# Patient Record
Sex: Female | Born: 1965 | Race: White | Hispanic: No | Marital: Married | Smoking: Former smoker
Health system: Southern US, Community
[De-identification: ages and names within clinical notes are randomized; demographics above are authoritative.]

## PROBLEM LIST (undated history)

## (undated) DIAGNOSIS — K5790 Diverticulosis of intestine, part unspecified, without perforation or abscess without bleeding: Secondary | ICD-10-CM

## (undated) DIAGNOSIS — K921 Melena: Secondary | ICD-10-CM

## (undated) DIAGNOSIS — Z8619 Personal history of other infectious and parasitic diseases: Secondary | ICD-10-CM

## (undated) HISTORY — DX: Melena: K92.1

## (undated) HISTORY — PX: COLONOSCOPY: SHX174

## (undated) HISTORY — DX: Diverticulosis of intestine, part unspecified, without perforation or abscess without bleeding: K57.90

## (undated) HISTORY — DX: Personal history of other infectious and parasitic diseases: Z86.19

## (undated) HISTORY — PX: WISDOM TOOTH EXTRACTION: SHX21

---

## 1997-05-10 ENCOUNTER — Inpatient Hospital Stay (HOSPITAL_COMMUNITY): Admission: AD | Admit: 1997-05-10 | Discharge: 1997-05-11 | Payer: Self-pay | Admitting: Obstetrics and Gynecology

## 1997-05-11 ENCOUNTER — Encounter (HOSPITAL_COMMUNITY): Admission: RE | Admit: 1997-05-11 | Discharge: 1997-08-09 | Payer: Self-pay | Admitting: Obstetrics and Gynecology

## 1997-06-13 ENCOUNTER — Other Ambulatory Visit: Admission: RE | Admit: 1997-06-13 | Discharge: 1997-06-13 | Payer: Self-pay | Admitting: Obstetrics and Gynecology

## 1997-08-11 ENCOUNTER — Encounter (HOSPITAL_COMMUNITY): Admission: RE | Admit: 1997-08-11 | Discharge: 1997-11-09 | Payer: Self-pay | Admitting: Obstetrics and Gynecology

## 1997-11-14 ENCOUNTER — Encounter (HOSPITAL_COMMUNITY): Admission: RE | Admit: 1997-11-14 | Discharge: 1998-02-12 | Payer: Self-pay | Admitting: *Deleted

## 1998-03-10 ENCOUNTER — Encounter (HOSPITAL_COMMUNITY): Admission: RE | Admit: 1998-03-10 | Discharge: 1998-07-09 | Payer: Self-pay | Admitting: *Deleted

## 1998-06-16 ENCOUNTER — Other Ambulatory Visit: Admission: RE | Admit: 1998-06-16 | Discharge: 1998-06-16 | Payer: Self-pay | Admitting: Obstetrics and Gynecology

## 1999-07-21 ENCOUNTER — Other Ambulatory Visit: Admission: RE | Admit: 1999-07-21 | Discharge: 1999-07-21 | Payer: Self-pay | Admitting: Obstetrics and Gynecology

## 2000-09-05 ENCOUNTER — Other Ambulatory Visit: Admission: RE | Admit: 2000-09-05 | Discharge: 2000-09-05 | Payer: Self-pay | Admitting: Obstetrics and Gynecology

## 2001-10-18 ENCOUNTER — Other Ambulatory Visit: Admission: RE | Admit: 2001-10-18 | Discharge: 2001-10-18 | Payer: Self-pay | Admitting: Obstetrics and Gynecology

## 2002-12-06 ENCOUNTER — Other Ambulatory Visit: Admission: RE | Admit: 2002-12-06 | Discharge: 2002-12-06 | Payer: Self-pay | Admitting: Obstetrics and Gynecology

## 2003-05-21 ENCOUNTER — Other Ambulatory Visit: Admission: RE | Admit: 2003-05-21 | Discharge: 2003-05-21 | Payer: Self-pay | Admitting: Obstetrics and Gynecology

## 2003-12-09 ENCOUNTER — Other Ambulatory Visit: Admission: RE | Admit: 2003-12-09 | Discharge: 2003-12-09 | Payer: Self-pay | Admitting: Obstetrics and Gynecology

## 2004-05-27 ENCOUNTER — Other Ambulatory Visit: Admission: RE | Admit: 2004-05-27 | Discharge: 2004-05-27 | Payer: Self-pay | Admitting: Obstetrics and Gynecology

## 2004-11-19 ENCOUNTER — Other Ambulatory Visit: Admission: RE | Admit: 2004-11-19 | Discharge: 2004-11-19 | Payer: Self-pay | Admitting: Obstetrics and Gynecology

## 2009-01-28 ENCOUNTER — Encounter (INDEPENDENT_AMBULATORY_CARE_PROVIDER_SITE_OTHER): Payer: Self-pay | Admitting: *Deleted

## 2009-03-20 ENCOUNTER — Ambulatory Visit: Payer: Self-pay | Admitting: Gastroenterology

## 2009-03-20 DIAGNOSIS — R195 Other fecal abnormalities: Secondary | ICD-10-CM

## 2009-03-25 ENCOUNTER — Encounter (INDEPENDENT_AMBULATORY_CARE_PROVIDER_SITE_OTHER): Payer: Self-pay | Admitting: *Deleted

## 2009-04-10 ENCOUNTER — Encounter (INDEPENDENT_AMBULATORY_CARE_PROVIDER_SITE_OTHER): Payer: Self-pay | Admitting: *Deleted

## 2009-04-11 ENCOUNTER — Ambulatory Visit: Payer: Self-pay | Admitting: Gastroenterology

## 2009-04-18 ENCOUNTER — Ambulatory Visit: Payer: Self-pay | Admitting: Gastroenterology

## 2010-03-17 NOTE — Letter (Signed)
Summary: Results Letter  [facility]  [address] [place], [place] [address]   Phone: [phone]  Fax: [phone]        March 20, 2009 MRN: [id number]    [facility] [place] [address] [place], [place]  [address]    Dear Ms. Cala,  It is my pleasure to have treated you recently as a new patient in my office. I appreciate your confidence and the opportunity to participate in your care.  Since I do have a busy inpatient endoscopy schedule and office schedule, my office hours vary weekly. I am, however, available for emergency calls everyday through my office. If I am not available for an urgent office appointment, another one of our gastroenterologist will be able to assist you.  My well-trained staff are prepared to help you at all times. For emergencies after office hours, a physician from our Gastroenterology section is always available through my 24 hour answering service  Once again I welcome you as a new patient and I look forward to a happy and healthy relationship             Sincerely,  Louis Meckel MD  This letter has been electronically signed by your physician.  Appended Document: Results Letter letter mailed

## 2010-03-17 NOTE — Letter (Signed)
Summary: [facility] Instructions  [facility]  [address] [place], [place] [address]   Phone: [phone]  Fax: [phone]       Joanna Velez    [birth year]    MRN: [id number]        Procedure Day Dorna Bloom:  Farrell Ours  2:30PM     Arrival Time:  1:30PM     Procedure Time:  2:30PM     Location of Procedure:                    _ X_  [facility] (4th Floor)   PREPARATION FOR COLONOSCOPY WITH MOVIPREP   Starting 5 days prior to your procedure 04/13/09 do not eat nuts, seeds, popcorn, corn, beans, peas,  salads, or any raw vegetables.  Do not take any fiber supplements (e.g. Metamucil, Citrucel, and Benefiber).  THE DAY BEFORE YOUR PROCEDURE         DATE: 04/17/09  DAY: THURSDAY  1.  Drink clear liquids the entire day-NO SOLID FOOD  2.  Do not drink anything colored red or purple.  Avoid juices with pulp.  No orange juice.  3.  Drink at least 64 oz. (8 glasses) of fluid/clear liquids during the day to prevent dehydration and help the prep work efficiently.  CLEAR LIQUIDS INCLUDE: Water Jello Ice Popsicles Tea (sugar ok, no milk/cream) Powdered fruit flavored drinks Coffee (sugar ok, no milk/cream) Gatorade Juice: apple, white grape, white cranberry  Lemonade Clear bullion, consomm, broth Carbonated beverages (any kind) Strained chicken noodle soup Hard Candy                             4.  In the morning, mix first dose of MoviPrep solution:    Empty 1 Pouch A and 1 Pouch B into the disposable container    Add lukewarm drinking water to the top line of the container. Mix to dissolve    Refrigerate (mixed solution should be used within 24 hrs)  5.  Begin drinking the prep at 5:00 p.m. The MoviPrep container is divided by 4 marks.   Every 15 minutes drink the solution down to the next mark (approximately 8 oz) until the full liter is complete.   6.  Follow completed prep with 16 oz of clear liquid of your choice (Nothing red or purple).   Continue to drink clear liquids until bedtime.  7.  Before going to bed, mix second dose of MoviPrep solution:    Empty 1 Pouch A and 1 Pouch B into the disposable container    Add lukewarm drinking water to the top line of the container. Mix to dissolve    Refrigerate  THE DAY OF YOUR PROCEDURE      DATE: 04/18/09  DAY: FRIDAY  Beginning at 9:30AM (5 hours before procedure):         1. Every 15 minutes, drink the solution down to the next mark (approx 8 oz) until the full liter is complete.  2. Follow completed prep with 16 oz. of clear liquid of your choice.    3. You may drink clear liquids until 12:30PM (2 HOURS BEFORE PROCEDURE).   MEDICATION INSTRUCTIONS  Unless otherwise instructed, you should take regular prescription medications with a small sip of water   as early as possible the morning of your procedure.         OTHER INSTRUCTIONS  You will need a responsible adult at  least 47 years of age to accompany you and drive you home.   This person must remain in the waiting room during your procedure.  Wear loose fitting clothing that is easily removed.  Leave jewelry and other valuables at home.  However, you may wish to bring a book to read or  an iPod/MP3 player to listen to music as you wait for your procedure to start.  Remove all body piercing jewelry and leave at home.  Total time from sign-in until discharge is approximately 2-3 hours.  You should go home directly after your procedure and rest.  You can resume normal activities the  day after your procedure.  The day of your procedure you should not:   Drive   Make legal decisions   Operate machinery   Drink alcohol   Return to work  You will receive specific instructions about eating, activities and medications before you leave.    The above instructions have been reviewed and explained to me by   Wyona Almas RN  April 11, 2009 1:13 PM     I fully understand and can verbalize these  instructions _____________________________ Date _________

## 2010-03-17 NOTE — Assessment & Plan Note (Signed)
Summary: RECTAL BLEEDING--[facility]   History of Present Illness Visit Type: Initial Consult Primary GI MD: Melvia Heaps MD [facility] Primary Provider: Oval Linsey, PA Requesting Provider: Richardean Chimera, MD Chief Complaint: Rectal bleed, hemorrhoid History of Present Illness:   Joanna Velez is a pleasant 45 year old white female referred at the request of Dr.McComb for evaluation of Hemoccult-positive stools. She has noted bright red blood on the toilet tissue which she attributes to a skin tag.  She has occasional rectal discomfort.  There has been no change in bowel habits or melena.  Hemoccults returned positive though she is unsure whether she was bleeding at that time.   GI Review of Systems    Reports weight gain.      Denies abdominal pain, acid reflux, belching, bloating, chest pain, dysphagia with liquids, dysphagia with solids, heartburn, loss of appetite, nausea, vomiting, vomiting blood, and  weight loss.      Reports hemorrhoids, rectal bleeding, and  rectal pain.     Denies anal fissure, black tarry stools, change in bowel habit, constipation, diarrhea, diverticulosis, fecal incontinence, heme positive stool, irritable bowel syndrome, jaundice, light color stool, and  liver problems. Preventive Screening-Counseling & Management  Alcohol-Tobacco     Smoking Status: quit  Caffeine-Diet-Exercise     Does Patient Exercise: yes      Drug Use:  no.      Current Medications (verified): 1)  Multivitamins  Tabs (Multiple Vitamin) .... 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  Alcohol Use - no Daily Caffeine Use -2 Illicit Drug Use - no Patient gets regular exercise. Smoking Status:  quit Drug Use:   no Does Patient Exercise:  yes  Review of Systems       The patient complains of back pain and night sweats.         All other systems were reviewed and were negative   Vital Signs:  Patient profile:   45 year old female Height:      63.5 inches Weight:      160.13 pounds BMI:     28.02 Pulse rate:   76 / minute Pulse rhythm:   regular BP sitting:   100 / 72  (left arm) Cuff size:   regular  Vitals Entered By: Joanna Velez CMA Duncan Dull) (March 20, 2009 10:04 AM)  Physical Exam  Additional Exam:  She is a well-developed well-nourished female  skin: anicteric HEENT: normocephalic; PEERLA; no nasal or pharyngeal abnormalities neck: supple nodes: no cervical lymphadenopathy chest: clear to ausculatation and percussion heart: no murmurs, gallops, or rubs abd: soft, nontender; BS normoactive; no abdominal masses, tenderness, organomegaly rectal: no masses; external skin tag is present ext: no cynanosis, clubbing, edema skeletal: no deformities neuro: oriented x 3; no focal abnormalities    Impression & Recommendations:  Problem # 1:  FECAL OCCULT BLOOD (ICD-792.1) Heme positive stool may be related to her skin tag and hemorrhoidal disease.  A more proximal colonic in source ought to be ruled out.  Recommendations #1 initial HC suppositories p.r.n. #2 colonoscopy  Risks, alternatives, and complications of the procedure, including bleeding, perforation, and possible need for surgery, were explained to the patient.  Patient's questions were answered.  Patient Instructions: 1)  Colonoscopy and Flexible Sigmoidoscopy brochure given.  2)  Conscious Sedation brochure given.  3)  You will need to call back at your convenience to schedule your Colonoscopy 4)  At that time you will be scheduled for a Previsit with a Nurse to sign papers and get all instructions 5)  cc Dr. Richardean Chimera 6)  The medication list was reviewed and reconciled.  All changed / newly prescribed  medications were explained.  A complete medication list was provided to the patient / caregiver. Prescriptions: ANUSOL-HC 25 MG SUPP (HYDROCORTISONE ACETATE) take one suppository per rectum q.h.s.  #7 x 0   Entered and Authorized by:   Louis Meckel MD   Signed by:   Louis Meckel MD on 03/20/2009   Method used:   Electronically to        [facility]* (retail)       [address]       [place], [place]  [address]       Ph: [phone]       Fax: [phone]   RxID:   [id number]

## 2010-03-17 NOTE — Letter (Signed)
Summary: Previsit letter  [facility]  [address] [place], [place] [address]   Phone: [phone]  Fax: [phone]       03/25/2009 MRN: [id number]  [facility] [place] [address] [place], [place]  [address]  Dear Ms. Menken,  Welcome to the Gastroenterology Division at [facility].    You are scheduled to see a nurse for your pre-procedure visit on 04-11-09 at 1pm on the 3rd floor at [facility], [address]  We ask that you try to arrive at our office 15 minutes prior to your appointment time to allow for check-in.  Your nurse visit will consist of discussing your medical and surgical history, your immediate family medical history, and your medications.    Please bring a complete list of all your medications or, if you prefer, bring the medication bottles and we will list them.  We will need to be aware of both prescribed and over the counter drugs.  We will need to know exact dosage information as well.  If you are on blood thinners (Coumadin, Plavix, Aggrenox, Ticlid, etc.) please call our office today/prior to your appointment, as we need to consult with your physician about holding your medication.   Please be prepared to read and sign documents such as consent forms, a financial agreement, and acknowledgement forms.  If necessary, and with your consent, a friend or relative is welcome to sit-in on the nurse visit with you.  Please bring your insurance card so that we may make a copy of it.  If your insurance requires a referral to see a specialist, please bring your referral form from your primary care physician.  No co-pay is required for this nurse visit.     If you cannot keep your appointment, please call [phone] to cancel or reschedule prior to your appointment date.  This allows [place] the opportunity to schedule an appointment for another patient in need of care.    Thank you for choosing [facility] for your medical needs.  We appreciate  the opportunity to care for you.  Please visit [place] at our website  to learn more about our practice.                     Sincerely.                                                                                                                   The Gastroenterology Division

## 2010-03-17 NOTE — Procedures (Signed)
Summary: Colonoscopy  Patient: Joanna Velez Note: All result statuses are Final unless otherwise noted.  Tests: (1) Colonoscopy (COL)   COL Colonoscopy           DONE     [facility]     [address]     [place], [place]  [address]           COLONOSCOPY PROCEDURE REPORT           PATIENT:  Joanna, Velez  MR#:  [id number]     BIRTHDATE:  1965-11-27, 44 yrs. old  GENDER:  female           ENDOSCOPIST:  Barbette Hair. Arlyce Dice, MD     Referred by:           PROCEDURE DATE:  04/18/2009     PROCEDURE:  Colonoscopy, Diagnostic     ASA CLASS:  Class I     INDICATIONS:  heme positive stool           MEDICATIONS:   Fentanyl 75 mcg IV, Versed 7 mg IV           DESCRIPTION OF PROCEDURE:   After the risks benefits and     alternatives of the procedure were thoroughly explained, informed     consent was obtained.  Digital rectal exam was performed and     revealed perianal skin tags.   The LB [id number] endoscope     was introduced through the anus and advanced to the cecum, which     was identified by both the appendix and ileocecal valve, without     limitations.  The quality of the prep was excellent, using     MoviPrep.  The instrument was then slowly withdrawn as the colon     was fully examined.     <<PROCEDUREIMAGES>>           FINDINGS:  Scattered diverticula were found (see image1, image4,     image5, and image11). sigmoid to ascending colon  This was     otherwise a normal examination of the colon (see image2, image3,     image7, and image12).   Retroflexed views in the rectum revealed     no abnormalities.    The scope was then withdrawn from the patient     and the procedure completed.           COMPLICATIONS:  None           ENDOSCOPIC IMPRESSION:     1) Diverticula, scattered     2) Otherwise normal examination           Heme positive stool likely secondary to peranal skin tag           RECOMMENDATIONS:     1) colonoscopy 10 years           REPEAT  EXAM:  In 10 year(s) for Colonoscopy.           ______________________________     Barbette Hair. Arlyce Dice, MD           CC:  Richardean Chimera, MD           n.     Rosalie DoctorBarbette Hair. Josealberto Montalto at 04/18/2009 03:04 PM           Karilyn Cota, [id number]  Note: An exclamation mark (!) indicates a result that was not dispersed into the flowsheet. Document Creation Date: 04/18/2009 3:04 PM _______________________________________________________________________  (1) Order result  status: Final Collection or observation date-time: 04/18/2009 14:56 Requested date-time:  Receipt date-time:  Reported date-time:  Referring Physician:   Ordering Physician: Melvia Heaps [phone]) Specimen Source:  Source: Launa Grill Order Number: [id number] Lab site:   Appended Document: Colonoscopy    Clinical Lists Changes  Observations: Added new observation of COLONNXTDUE: 04/2019 (04/18/2009 15:43)

## 2010-03-17 NOTE — Miscellaneous (Signed)
Summary: [facility] Previsit/prep  Clinical Lists Changes  Medications: Added new medication of MOVIPREP 100 GM  SOLR (PEG-KCL-NACL-NASULF-NA ASC-C) As per prep instructions. - Signed Rx of MOVIPREP 100 GM  SOLR (PEG-KCL-NACL-NASULF-NA ASC-C) As per prep instructions.;  #1 x 0;  Signed;  Entered by: Wyona Almas RN;  Authorized by: Louis Meckel MD;  Method used: Electronically to [facility]*, [address], [place], [place]  [address], Ph: [phone], Fax: [phone] Observations: Added new observation of NKA: T (04/11/2009 12:53)    Prescriptions: MOVIPREP 100 GM  SOLR (PEG-KCL-NACL-NASULF-NA ASC-C) As per prep instructions.  #1 x 0   Entered by:   Wyona Almas RN   Authorized by:   Louis Meckel MD   Signed by:   Wyona Almas RN on 04/11/2009   Method used:   Electronically to        [facility]* (retail)       [address]       [place], [place]  [address]       Ph: [phone]       Fax: [phone]   RxID:   [id number]

## 2014-06-12 ENCOUNTER — Encounter: Payer: Self-pay | Admitting: Gastroenterology

## 2014-07-03 ENCOUNTER — Ambulatory Visit (INDEPENDENT_AMBULATORY_CARE_PROVIDER_SITE_OTHER): Payer: BLUE CROSS/BLUE SHIELD | Admitting: Gastroenterology

## 2014-07-03 ENCOUNTER — Encounter: Payer: Self-pay | Admitting: Gastroenterology

## 2014-07-03 VITALS — BP 108/80 | HR 60 | Ht 62.75 in | Wt 175.5 lb

## 2014-07-03 DIAGNOSIS — K625 Hemorrhage of anus and rectum: Secondary | ICD-10-CM | POA: Diagnosis not present

## 2014-07-03 NOTE — Progress Notes (Signed)
    _                                                                                                                History of Present Illness:  Ms. Joanna Velez is a pleasant 49 year old white female referred at the request of Dr.McComb evaluation of rectal bleeding.  On several occasions she is noticed bright red blood mixed with the stools and in the toilet water.  Mucus has been seen as well.  On each occasion this occurred after a running marathon of 12-13 miles.  She denies abdominal pain.  Colonoscopy for Hemoccult-positive stool and limited rectal bleeding in 2011 demonstrated external skin tags only and scattered diverticula.   Past Medical History  Diagnosis Date  . Diverticulosis    History reviewed. No pertinent past surgical history. family history includes Alzheimer's disease in her paternal grandmother; Diabetes in her father; Gallbladder disease in her father; Heart defect in her daughter; Heart disease in her maternal grandfather; Lung cancer in her paternal grandfather; Macular degeneration in her mother; Stroke in her father. Current Outpatient Prescriptions  Medication Sig Dispense Refill  . calcium carbonate (OS-CAL) 600 MG TABS tablet Take 600 mg by mouth daily with breakfast.    . Cyanocobalamin (VITAMIN B12 PO) Take 1 tablet by mouth daily.    Marland Kitchen. ECHINACEA PO Take 1 tablet by mouth daily.    . Multiple Vitamins-Minerals (CENTRUM SILVER ULTRA WOMENS PO) Take 1 tablet by mouth daily.     No current facility-administered medications for this visit.   Allergies as of 07/03/2014  . (No Known Allergies)    reports that she quit smoking about 21 years ago. Her smoking use included Cigarettes. She has never used smokeless tobacco. She reports that she drinks alcohol. She reports that she does not use illicit drugs.   Review of Systems: Pertinent positive and negative review of systems were noted in the above HPI section. All other review of systems were otherwise  negative.  Vital signs were reviewed in today's medical record Physical Exam: General: Well developed , well nourished, no acute distress Skin: anicteric Head: Normocephalic and atraumatic Eyes:  sclerae anicteric, EOMI Ears: Normal auditory acuity Mouth: No deformity or lesions Neck: Supple, no masses or thyromegaly Lymph Nodes: no lymphadenopathy Lungs: Clear throughout to auscultation Heart: Regular rate and rhythm; no murmurs, rubs or bruits Gastroinestinal: Soft, non tender and non distended. No masses, hepatosplenomegaly or hernias noted. Normal Bowel sounds Rectal: External skin tag Musculoskeletal: Symmetrical with no gross deformities  Skin: No lesions on visible extremities Pulses:  Normal pulses noted Extremities: No clubbing, cyanosis, edema or deformities noted Neurological: Alert oriented x 4, grossly nonfocal Cervical Nodes:  No significant cervical adenopathy Inguinal Nodes: No significant inguinal adenopathy Psychological:  Alert and cooperative. Normal mood and affect  See Assessment and Plan under Problem List

## 2014-07-03 NOTE — Assessment & Plan Note (Signed)
Joanna Velez has had limited but gross hematochezia several times after a marathon run.  This raises the question of ischemic colitis.  Alternatively, bleeding could be hemorrhoidal.  Recommendations #1 sigmoidoscopy; if no mucosal abnormalities are seen would try to evaluate the patient immediately after an episode of bleeding.  I also suggested that she may reconsider distance running because of the possibility of ischemia.  She is able to run 2-3 miles regularly without incident.  CC Dr. Anthoney HaradaJohn Macomb

## 2014-07-03 NOTE — Patient Instructions (Signed)
You have been scheduled for a flexible sigmoidoscopy. Please follow the written instructions given to you at your visit today. If you use inhalers (even only as needed), please bring them with you on the day of your procedure.  

## 2014-07-09 ENCOUNTER — Encounter: Payer: Self-pay | Admitting: Gastroenterology

## 2014-07-12 ENCOUNTER — Ambulatory Visit (AMBULATORY_SURGERY_CENTER): Payer: BLUE CROSS/BLUE SHIELD | Admitting: Gastroenterology

## 2014-07-12 ENCOUNTER — Encounter: Payer: Self-pay | Admitting: Gastroenterology

## 2014-07-12 VITALS — BP 113/69 | HR 58 | Temp 96.8°F | Resp 22 | Ht 62.0 in | Wt 175.0 lb

## 2014-07-12 DIAGNOSIS — K648 Other hemorrhoids: Secondary | ICD-10-CM

## 2014-07-12 DIAGNOSIS — K625 Hemorrhage of anus and rectum: Secondary | ICD-10-CM | POA: Diagnosis not present

## 2014-07-12 MED ORDER — SODIUM CHLORIDE 0.9 % IV SOLN: 500.0000 mL | INTRAVENOUS | Status: DC

## 2014-07-12 NOTE — Patient Instructions (Signed)
YOU HAD AN ENDOSCOPIC PROCEDURE TODAY AT THE [facility]:   Refer to the procedure report that was given to you for any specific questions about what was found during the examination.  If the procedure report does not answer your questions, please call your gastroenterologist to clarify.  If you requested that your care partner not be given the details of your procedure findings, then the procedure report has been included in a sealed envelope for you to review at your convenience later.  YOU SHOULD EXPECT: Some feelings of bloating in the abdomen. Passage of more gas than usual.  Walking can help get rid of the air that was put into your GI tract during the procedure and reduce the bloating. If you had a lower endoscopy (such as a colonoscopy or flexible sigmoidoscopy) you may notice spotting of blood in your stool or on the toilet paper. If you underwent a bowel prep for your procedure, you may not have a normal bowel movement for a few days.  Please Note:  You might notice some irritation and congestion in your nose or some drainage.  This is from the oxygen used during your procedure.  There is no need for concern and it should clear up in a day or so.  SYMPTOMS TO REPORT IMMEDIATELY:   Following lower endoscopy (colonoscopy or flexible sigmoidoscopy):  Excessive amounts of blood in the stool  Significant tenderness or worsening of abdominal pains  Swelling of the abdomen that is new, acute  Fever of 100F or higher   For urgent or emergent issues, a gastroenterologist can be reached at any hour by calling [phone].   DIET: Your first meal following the procedure should be a small meal and then it is ok to progress to your normal diet. Heavy or fried foods are harder to digest and may make you feel nauseous or bloated.  Likewise, meals heavy in dairy and vegetables can increase bloating.  Drink plenty of fluids but you should avoid alcoholic beverages for 24  hours.  ACTIVITY:  You should plan to take it easy for the rest of today and you should NOT DRIVE or use heavy machinery until tomorrow (because of the sedation medicines used during the test).    FOLLOW UP: Our staff will call the number listed on your records the next business day following your procedure to check on you and address any questions or concerns that you may have regarding the information given to you following your procedure. If we do not reach you, we will leave a message.  However, if you are feeling well and you are not experiencing any problems, there is no need to return our call.  We will assume that you have returned to your regular daily activities without incident.  If any biopsies were taken you will be contacted by phone or by letter within the next 1-3 weeks.  Please call us at [phone] if you have not heard about the biopsies in 3 weeks.    SIGNATURES/CONFIDENTIALITY: You and/or your care partner have signed paperwork which will be entered into your electronic medical record.  These signatures attest to the fact that that the information above on your After Visit Summary has been reviewed and is understood.  Full responsibility of the confidentiality of this discharge information lies with you and/or your care-partner.  Hemorrhoids, Diverticulosis, high fiber diet-handouts given  Repeat colonoscopy 10 years 2026.

## 2014-07-12 NOTE — Progress Notes (Signed)
Report to PACU, RN, vss, BBS= Clear.  

## 2014-07-12 NOTE — Op Note (Signed)
[facility] [address] [place], [id number]   FLEXIBLE SIGMOIDOSCOPY PROCEDURE REPORT  PATIENT: Joanna Velez, Joanna Velez  MR#: [id number] BIRTHDATE: 05-18-1965 , 49  yrs. old GENDER: female ENDOSCOPIST: Louis Meckelobert D Roshawn Lacina, MD REFERRED BY: Richardean ChimeraJohn McComb, M.D. PROCEDURE DATE:  07/12/2014 PROCEDURE:   Sigmoidoscopy, diagnostic ASA CLASS:   Class II INDICATIONS:anal bleeding. MEDICATIONS: Monitored anesthesia care and Propofol 120 mg IV  DESCRIPTION OF PROCEDURE:   After the risks benefits and alternatives of the procedure were thoroughly explained, informed consent was obtained.  Digital exam revealed several skin tags. The LB [id number]  endoscope was introduced through the anus  and advanced to the descending colon , The exam was Without limitations.    The quality of the prep was    . Estimated blood loss is zero unless otherwise noted in this procedure report. The instrument was then slowly withdrawn as the mucosa was fully examined.         COLON FINDINGS: There was mild diverticulosis noted in the descending colon.   Internal hemorrhoids were found.    Retroflexed views revealed no abnormalities.    The scope was then withdrawn from the patient and the procedure terminated.  COMPLICATIONS: There were no immediate complications.  ENDOSCOPIC IMPRESSION: 1.   Mild diverticulosis was noted in the descending colon 2.   Internal hemorrhoids  RECOMMENDATIONS: hemorrhoidal suppositories as necessary  REPEAT EXAM:  eSigned:  Louis Meckelobert D Larina Lieurance, MD 07/12/2014 11:04 AM   CC:

## 2014-07-16 ENCOUNTER — Telehealth: Payer: Self-pay | Admitting: Emergency Medicine

## 2014-07-16 NOTE — Telephone Encounter (Signed)
  Follow up Call-  Call back number 07/12/2014  Post procedure Call Back phone  # [phone]  Permission to leave phone message Yes     Patient questions:  Do you have a fever, pain , or abdominal swelling? No. Pain Score  0 *  Have you tolerated food without any problems? Yes.    Have you been able to return to your normal activities? Yes.    Do you have any questions about your discharge instructions: Diet   No. Medications  No. Follow up visit  No.  Do you have questions or concerns about your Care? No.  Actions: * If pain score is 4 or above: No action needed, pain <4.

## 2018-08-21 NOTE — Progress Notes (Signed)
TELEHEALTH VISIT  Referring Provider: Richardean ChimeraMcComb, John, MD Primary Care Physician:  Richardean ChimeraMcComb, John, MD   Tele-visit due to COVID-19 pandemic Patient requested visit virtually, consented to the virtual encounter via video enabled telemedicine application Contact made at: 13:59 08/22/18 Patient verified by name and date of birth Location of patient: Car Location provider: [place] [facility] Names of persons participating: Me, patient, Joanna LeverDesiree Velez CMA Time spent on telehealth visit:  I discussed the limitations of evaluation and management by telemedicine. The patient expressed understanding and agreed to proceed.  Reason for Consultation: Melena   IMPRESSION:  Blood in the stool without associated anemia Skin tag as noted source of heme + stools by Dr. Arlyce DiceKaplan in 2011 Sigmoid diverticulosis seen on prior endoscopy No known family history of colon cancer or polyps  The differential for rectal bleeding without rectal pain is broad.  It includes outlet sources such as fissure or hemorrhoids, as well as her known skin tag, polyps, mass, ulcers, and colitis. She feels that the volume of bleeding is significant more than when she had bleeding from the skin tag. Given this differential I am recommending a colonoscopy.  PLAN: High fiber diet recommended, drink at least 1.5-2 liters of water Daily stool bulking agent with psyllium or methylcellulose recommended Colonoscopy Referral to Dr. Cliffton AstersWhite will be considered after colonoscopy given the history of bleeding from the skin tag  I consented the patient discussing the risks, benefits, and alternatives to endoscopic evaluation. In particular, we discussed the risks that include, but are not limited to, reaction to medication, cardiopulmonary compromise, bleeding requiring blood transfusion, aspiration resulting in pneumonia, perforation requiring surgery, lack of diagnosis, severe illness requiring hospitalization, and even death. We reviewed  the risk of missed lesion including polyps or even cancer. The patient acknowledges these risks and asks that we proceed.     HPI: Joanna Velez is a 53 y.o. female referred by Dr. Arelia SneddonMcComb for further evaluation of melena.  The history is obtained to the patient, review of her electronic health record, and referral records from Dr. Arelia SneddonMcComb. She reported blood in the stool at the time of her annual exam.   Has noticed more bright red blood in the stool a couple of weeks ago. Last occurred 4 weeks ago. Has historically had bleeding due to internal hemorrhoids when exercising but the volume of bleeding is much smaller than what she is experienced recently. She feels a skin tag at her rectum.  No associated rectal pain. No change in bowel habits. No straining. No sense of incomplete evacuation. No other associated symptoms. No identified exacerbating or relieving features.   She had a colonoscopy with Dr. Arlyce DiceKaplan for heme positive stools 04/18/09 that showed scattered diverticulosis.  He attributed the heme positive stool to a perianal skin tag.  Repeat colonoscopy recommended in 10 years.  She had a flexible sigmoidoscopy with Dr. Arlyce DiceKaplan 07/12/2014 to evaluate anorectal bleeding showed mild diverticulosis in the descending colon and internal hemorrhoids.  Hemoglobin 08/02/18: 14.7  Father with gallbladder issues. No known family history of colon cancer or polyps. No family history of uterine/endometrial cancer, pancreatic cancer or gastric/stomach cancer.  Past Medical History:  Diagnosis Date  . Diverticulosis     No past surgical history on file.  Current Outpatient Medications  Medication Sig Dispense Refill  . calcium carbonate (OS-CAL) 600 MG TABS tablet Take 600 mg by mouth daily with breakfast.    . Cyanocobalamin (VITAMIN B12 PO) Take 1 tablet by mouth daily.    .Marland Kitchen  ECHINACEA PO Take 1 tablet by mouth daily.    . Multiple Vitamins-Minerals (CENTRUM SILVER ULTRA WOMENS PO) Take 1 tablet by  mouth daily.     No current facility-administered medications for this visit.     Allergies as of 08/22/2018  . (No Known Allergies)    Family History  Problem Relation Age of Onset  . Macular degeneration Mother   . Stroke Father   . Gallbladder disease Father   . Heart disease Maternal Grandfather   . Lung cancer Paternal Grandfather   . Alzheimer's disease Paternal Grandmother   . Heart defect Daughter   . Diabetes Father     Social History   Socioeconomic History  . Marital status: Married    Spouse name: Not on file  . Number of children: 2  . Years of education: Not on file  . Highest education level: Not on file  Occupational History  . Occupation: Doctor, hospital  . Financial resource strain: Not on file  . Food insecurity    Worry: Not on file    Inability: Not on file  . Transportation needs    Medical: Not on file    Non-medical: Not on file  Tobacco Use  . Smoking status: Former Smoker    Types: Cigarettes    Quit date: 02/15/1993    Years since quitting: 25.5  . Smokeless tobacco: Never Used  Substance and Sexual Activity  . Alcohol use: Yes    Alcohol/week: 2.0 standard drinks    Types: 2 Glasses of wine per week    Comment: 1-2 per week  . Drug use: No  . Sexual activity: Not on file  Lifestyle  . Physical activity    Days per week: Not on file    Minutes per session: Not on file  . Stress: Not on file  Relationships  . Social Herbalist on phone: Not on file    Gets together: Not on file    Attends religious service: Not on file    Active member of club or organization: Not on file    Attends meetings of clubs or organizations: Not on file    Relationship status: Not on file  . Intimate partner violence    Fear of current or ex partner: Not on file    Emotionally abused: Not on file    Physically abused: Not on file    Forced sexual activity: Not on file  Other Topics Concern  . Not on file  Social History  Narrative  . Not on file    Review of Systems: ALL ROS discussed and all others negative except listed in HPI.  Physical Exam: Complete physical exam not performed due to the limits inherent in a telehealth encounter.  General: Awake, alert, and oriented, and well communicative. In no acute distress.  HEENT: EOMI, non-icteric sclera, NCAT, MMM  Neck: Normal movement of head and neck  Pulm: No labored breathing, speaking in full sentences without conversational dyspnea  Derm: No apparent lesions or bruising in visible field  MS: Moves all visible extremities without noticeable abnormality  Psych: Pleasant, cooperative, normal speech, normal affect and normal insight Neuro: Alert and appropriate   Joanna Gonzalo L. Tarri Glenn, MD, MPH [facility] 08/21/2018, 3:46 PM

## 2018-08-22 ENCOUNTER — Ambulatory Visit (INDEPENDENT_AMBULATORY_CARE_PROVIDER_SITE_OTHER): Payer: Managed Care, Other (non HMO) | Admitting: Gastroenterology

## 2018-08-22 ENCOUNTER — Encounter: Payer: Self-pay | Admitting: Gastroenterology

## 2018-08-22 ENCOUNTER — Other Ambulatory Visit: Payer: Self-pay

## 2018-08-22 DIAGNOSIS — K921 Melena: Secondary | ICD-10-CM | POA: Diagnosis not present

## 2018-08-22 MED ORDER — NA SULFATE-K SULFATE-MG SULF 17.5-3.13-1.6 GM/177ML PO SOLN: 1.0000 | ORAL | 0 refills | Status: DC

## 2018-08-22 NOTE — Patient Instructions (Addendum)
I am recommending a high-fiber diet.  If you are unable to add fresh fruits vegetables and bran fibers to consume at least 20 to 25 g of fiber daily I recommend using a stool bulking agent with psyllium or methylcellulose such as Benefiber, Metamucil, or Citrucel.  Drink at least 1.5 to 2 L of water daily.  We will schedule a colonoscopy at your convenience.  You may benefit for my consultation with Dr. Nadeen Landau to consider having your perianal skin tag removed.  I will be able to clarify this recommendation at the time of your colonoscopy.  Thank you for your patience with me and our technology today! Please stay home, safe, and healthy. I look forward to meeting you in person in the future.  Please call with any questions or concerns in the meantime.   High-Fiber Diet Fiber, also called dietary fiber, is a type of carbohydrate that is found in fruits, vegetables, whole grains, and beans. A high-fiber diet can have many health benefits. Your health care provider may recommend a high-fiber diet to help:  Prevent constipation. Fiber can make your bowel movements more regular.  Lower your cholesterol.  Relieve the following conditions: ? Swelling of veins in the anus (hemorrhoids). ? Swelling and irritation (inflammation) of specific areas of the digestive tract (uncomplicated diverticulosis). ? A problem of the large intestine (colon) that sometimes causes pain and diarrhea (irritable bowel syndrome, IBS).  Prevent overeating as part of a weight-loss plan.  Prevent heart disease, type 2 diabetes, and certain cancers. What is my plan? The recommended daily fiber intake in grams (g) includes:  38 g for men age 53 or younger.  30 g for men over age 71.  62 g for women age 77 or younger.  21 g for women over age 26. You can get the recommended daily intake of dietary fiber by:  Eating a variety of fruits, vegetables, grains, and beans.  Taking a fiber supplement, if it is  not possible to get enough fiber through your diet. What do I need to know about a high-fiber diet?  It is better to get fiber through food sources rather than from fiber supplements. There is not a lot of research about how effective supplements are.  Always check the fiber content on the nutrition facts label of any prepackaged food. Look for foods that contain 5 g of fiber or more per serving.  Talk with a diet and nutrition specialist (dietitian) if you have questions about specific foods that are recommended or not recommended for your medical condition, especially if those foods are not listed below.  Gradually increase how much fiber you consume. If you increase your intake of dietary fiber too quickly, you may have bloating, cramping, or gas.  Drink plenty of water. Water helps you to digest fiber. What are tips for following this plan?  Eat a wide variety of high-fiber foods.  Make sure that half of the grains that you eat each day are whole grains.  Eat breads and cereals that are made with whole-grain flour instead of refined flour or white flour.  Eat brown rice, bulgur wheat, or millet instead of white rice.  Start the day with a breakfast that is high in fiber, such as a cereal that contains 5 g of fiber or more per serving.  Use beans in place of meat in soups, salads, and pasta dishes.  Eat high-fiber snacks, such as berries, raw vegetables, nuts, and popcorn.  Choose whole fruits  and vegetables instead of processed forms like juice or sauce. What foods can I eat?  Fruits Berries. Pears. Apples. Oranges. Avocado. Prunes and raisins. Dried figs. Vegetables Sweet potatoes. Spinach. Kale. Artichokes. Cabbage. Broccoli. Cauliflower. Green peas. Carrots. Squash. Grains Whole-grain breads. Multigrain cereal. Oats and oatmeal. Brown rice. Barley. Bulgur wheat. Millet. Quinoa. Bran muffins. Popcorn. Rye wafer crackers. Meats and other proteins Navy, kidney, and pinto  beans. Soybeans. Split peas. Lentils. Nuts and seeds. Dairy Fiber-fortified yogurt. Beverages Fiber-fortified soy milk. Fiber-fortified orange juice. Other foods Fiber bars. The items listed above may not be a complete list of recommended foods and beverages. Contact a dietitian for more options. What foods are not recommended? Fruits Fruit juice. Cooked, strained fruit. Vegetables Fried potatoes. Canned vegetables. Well-cooked vegetables. Grains White bread. Pasta made with refined flour. White rice. Meats and other proteins Fatty cuts of meat. Fried chicken or fried fish. Dairy Milk. Yogurt. Cream cheese. Sour cream. Fats and oils Butters. Beverages Soft drinks. Other foods Cakes and pastries. The items listed above may not be a complete list of foods and beverages to avoid. Contact a dietitian for more information. Summary  Fiber is a type of carbohydrate. It is found in fruits, vegetables, whole grains, and beans.  There are many health benefits of eating a high-fiber diet, such as preventing constipation, lowering blood cholesterol, helping with weight loss, and reducing your risk of heart disease, diabetes, and certain cancers.  Gradually increase your intake of fiber. Increasing too fast can result in cramping, bloating, and gas. Drink plenty of water while you increase your fiber.  The best sources of fiber include whole fruits and vegetables, whole grains, nuts, seeds, and beans. This information is not intended to replace advice given to you by your health care provider. Make sure you discuss any questions you have with your health care provider. Document Released: 02/01/2005 Document Revised: 12/06/2016 Document Reviewed: 12/06/2016 Elsevier Patient Education  2020 [employer]

## 2018-09-13 ENCOUNTER — Encounter: Payer: Self-pay | Admitting: Gastroenterology

## 2018-09-15 ENCOUNTER — Telehealth: Payer: Self-pay | Admitting: Gastroenterology

## 2018-09-15 NOTE — Telephone Encounter (Signed)

## 2018-09-16 ENCOUNTER — Other Ambulatory Visit: Payer: Self-pay

## 2018-09-16 ENCOUNTER — Ambulatory Visit (AMBULATORY_SURGERY_CENTER): Payer: Managed Care, Other (non HMO) | Admitting: Gastroenterology

## 2018-09-16 ENCOUNTER — Encounter: Payer: Self-pay | Admitting: Gastroenterology

## 2018-09-16 VITALS — BP 124/62 | HR 62 | Temp 97.8°F | Resp 16 | Ht 62.75 in | Wt 175.0 lb

## 2018-09-16 DIAGNOSIS — K5730 Diverticulosis of large intestine without perforation or abscess without bleeding: Secondary | ICD-10-CM | POA: Diagnosis not present

## 2018-09-16 DIAGNOSIS — K635 Polyp of colon: Secondary | ICD-10-CM

## 2018-09-16 DIAGNOSIS — K921 Melena: Secondary | ICD-10-CM

## 2018-09-16 DIAGNOSIS — K648 Other hemorrhoids: Secondary | ICD-10-CM

## 2018-09-16 MED ORDER — HYDROCORTISONE (PERIANAL) 2.5 % EX CREA: 1.0000 "application " | TOPICAL_CREAM | Freq: Two times a day (BID) | CUTANEOUS | 0 refills | Status: DC

## 2018-09-16 MED ORDER — SODIUM CHLORIDE 0.9 % IV SOLN
500.0000 mL | Freq: Once | INTRAVENOUS | Status: DC
Start: 2018-09-16 — End: 2018-09-16

## 2018-09-16 NOTE — Patient Instructions (Signed)
You have anusol hc cream waiting for you at [facility].  The doctor also wants you to use metamucil for stool bulking.   Read all handouts given to you by your recovery room nurse.  YOU HAD AN ENDOSCOPIC PROCEDURE TODAY AT [facility]:   Refer to the procedure report that was given to you for any specific questions about what was found during the examination.  If the procedure report does not answer your questions, please call your gastroenterologist to clarify.  If you requested that your care partner not be given the details of your procedure findings, then the procedure report has been included in a sealed envelope for you to review at your convenience later.  YOU SHOULD EXPECT: Some feelings of bloating in the abdomen. Passage of more gas than usual.  Walking can help get rid of the air that was put into your GI tract during the procedure and reduce the bloating. If you had a lower endoscopy (such as a colonoscopy or flexible sigmoidoscopy) you may notice spotting of blood in your stool or on the toilet paper. If you underwent a bowel prep for your procedure, you may not have a normal bowel movement for a few days.  Please Note:  You might notice some irritation and congestion in your nose or some drainage.  This is from the oxygen used during your procedure.  There is no need for concern and it should clear up in a day or so.  SYMPTOMS TO REPORT IMMEDIATELY:   Following lower endoscopy (colonoscopy or flexible sigmoidoscopy):  Excessive amounts of blood in the stool  Significant tenderness or worsening of abdominal pains  Swelling of the abdomen that is new, acute  Fever of 100F or higher   For urgent or emergent issues, a gastroenterologist can be reached at any hour by calling [phone].   DIET:  We do recommend a small meal at first, but then you may proceed to your regular diet.  Drink plenty of fluids but you should avoid alcoholic beverages for 24 hours. Try to  increase the fiber in your diet, and drink plenty of water.  ACTIVITY:  You should plan to take it easy for the rest of today and you should NOT DRIVE or use heavy machinery until tomorrow (because of the sedation medicines used during the test).    FOLLOW UP: Our staff will call the number listed on your records 48-72 hours following your procedure to check on you and address any questions or concerns that you may have regarding the information given to you following your procedure. If we do not reach you, we will leave a message.  We will attempt to reach you two times.  During this call, we will ask if you have developed any symptoms of COVID 19. If you develop any symptoms (ie: fever, flu-like symptoms, shortness of breath, cough etc.) before then, please call [phone].  If you test positive for Covid 19 in the 2 weeks post procedure, please call and report this information to [place].    If any biopsies were taken you will be contacted by phone or by letter within the next 1-3 weeks.  Please call us at [phone] if you have not heard about the biopsies in 3 weeks.    SIGNATURES/CONFIDENTIALITY: You and/or your care partner have signed paperwork which will be entered into your electronic medical record.  These signatures attest to the fact that that the information above on your After Visit Summary  has been reviewed and is understood.  Full responsibility of the confidentiality of this discharge information lies with you and/or your care-partner. 

## 2018-09-16 NOTE — Progress Notes (Signed)
A/ox3, pleased with MAC, report to RN 

## 2018-09-16 NOTE — Progress Notes (Signed)
Called to room to assist during endoscopic procedure.  Patient ID and intended procedure confirmed with present staff. Received instructions for my participation in the procedure from the performing physician.  

## 2018-09-16 NOTE — Progress Notes (Signed)
Georgette Shell doing temps [place] Mote doing vitals No egg or soy allergy known to patient  No issues with past sedation with any surgeries  or procedures, no past intubation problems  No diet pills per patient No home 02 use per patient  No blood thinners per patient  Pt denies issues with constipation  No A fib or A flutter

## 2018-09-16 NOTE — Op Note (Addendum)
[facility] Patient Name: Joanna Velez Procedure Date: 09/16/2018 9:10 AM MRN: [id number] Endoscopist: Tressia DanasKimberly Loray Akard MD, MD Age: 53 Referring MD:  Date of Birth: [birth year] Gender: Female Account #: [id number] Procedure:                Colonoscopy Indications:              Blood in the stool without associated anemia                           Skin tag as noted source of heme + stools by Dr.                            Arlyce DiceKaplan in 2011                           Sigmoid diverticulosis seen on prior endoscopy                           No known family history of colon cancer or polyps Medicines:                See the Anesthesia note for documentation of the                            administered medications Procedure:                Pre-Anesthesia Assessment:                           - Prior to the procedure, a History and Physical                            was performed, and patient medications and                            allergies were reviewed. The patient's tolerance of                            previous anesthesia was also reviewed. The risks                            and benefits of the procedure and the sedation                            options and risks were discussed with the patient.                            All questions were answered, and informed consent                            was obtained. Prior Anticoagulants: The patient has                            taken no previous anticoagulant or antiplatelet  agents. ASA Grade Assessment: I - A normal, healthy                            patient. After reviewing the risks and benefits,                            the patient was deemed in satisfactory condition to                            undergo the procedure.                           After obtaining informed consent, the colonoscope                            was passed under direct vision. Throughout the       procedure, the patient's blood pressure, pulse, and                            oxygen saturations were monitored continuously. The                            Colonoscope was introduced through the anus and                            advanced to the the terminal ileum, with                            identification of the appendiceal orifice and IC                            valve. A second forward view of the right colon was                            performed. The colonoscopy was performed without                            difficulty. The patient tolerated the procedure                            well. The quality of the bowel preparation was                            good. The terminal ileum, ileocecal valve,                            appendiceal orifice, and rectum were photographed. Scope In: 9:16:16 AM Scope Out: 9:30:40 AM Scope Withdrawal Time: 0 hours 11 minutes 3 seconds  Total Procedure Duration: 0 hours 14 minutes 24 seconds  Findings:                 Small internal hemorrhoids were found. The  hemorrhoids were small. A small skin tag is present.                           A 2 mm polyp was found in the distal sigmoid colon.                            The polyp was sessile. The polyp was removed with a                            cold snare. Resection and retrieval were complete.                            Estimated blood loss was minimal.                           Multiple small and large-mouthed diverticula were                            found in the sigmoid colon and descending colon. A                            few scattered diverticula were seen in the right                            colon and transverse colon.                           The exam was otherwise without abnormality on                            direct and retroflexion views. Complications:            No immediate complications. Estimated blood loss:                             Minimal. Estimated Blood Loss:     Estimated blood loss was minimal. Impression:               - Small internal hemorrhoids - the likely sournce                            of blood in the stool.                           - One 2 mm polyp in the distal sigmoid colon,                            removed with a cold snare. Resected and retrieved.                           - Diverticulosis without diverticulitis.                           - The examination was otherwise normal on direct  and retroflexion views. Recommendation:           - Patient has a contact number available for                            emergencies. The signs and symptoms of potential                            delayed complications were discussed with the                            patient. Return to normal activities tomorrow.                            Written discharge instructions were provided to the                            patient.                           - High fiber diet recommended. You should eat 25-35                            grams of fiber daily.                           - Drink plenty of water - at least 64 ounces a day.                           - Continue present medications.                           - Daily stool bulking agent such as Metamucil                            recommended.                           - Local therapy for hemorrhoids as needed.                           - Anusol HC applied PR BID x 7 days when bleeding                            occurs.                           - Await pathology results.                           - Repeat colonoscopy in 7 years for surveillance if                            the polyp is an adenoma. If the polyp is benign,  repeat colonoscopy in 10 years. Thornton Park MD, MD 09/16/2018 9:43:07 AM This report has been signed electronically.

## 2018-09-20 ENCOUNTER — Telehealth: Payer: Self-pay

## 2018-09-20 ENCOUNTER — Encounter: Payer: Self-pay | Admitting: Gastroenterology

## 2018-09-20 NOTE — Telephone Encounter (Signed)
  Follow up Call-  Call back number 09/16/2018  Post procedure Call Back phone  # [phone]  Permission to leave phone message Yes  Some recent data might be hidden     Patient questions:  Do you have a fever, pain , or abdominal swelling? No. Pain Score  0 *  Have you tolerated food without any problems? Yes.    Have you been able to return to your normal activities? Yes.    Do you have any questions about your discharge instructions: Diet   No. Medications  No. Follow up visit  No.  Do you have questions or concerns about your Care? No.  Actions: * If pain score is 4 or above: 1. No action needed, pain <4.Have you developed a fever since your procedure? no  2.   Have you had an respiratory symptoms (SOB or cough) since your procedure? no  3.   Have you tested positive for COVID 19 since your procedure no  4.   Have you had any family members/close contacts diagnosed with the COVID 19 since your procedure?  no   If yes to any of these questions please route to Joylene John, RN and Alphonsa Gin, Therapist, sports.

## 2018-09-21 ENCOUNTER — Ambulatory Visit (INDEPENDENT_AMBULATORY_CARE_PROVIDER_SITE_OTHER): Payer: Managed Care, Other (non HMO) | Admitting: Gastroenterology

## 2018-09-21 ENCOUNTER — Encounter: Payer: Self-pay | Admitting: Gastroenterology

## 2018-09-21 DIAGNOSIS — R748 Abnormal levels of other serum enzymes: Secondary | ICD-10-CM

## 2018-09-21 NOTE — Patient Instructions (Signed)
I have recommended some additional labs and an ultrasound to evaluate the liver enzymes.   Let's plan on a follow-up when these results are available.

## 2018-09-21 NOTE — Progress Notes (Signed)
TELEHEALTH VISIT  Referring Provider: Arvella Nigh, MD Primary Care Physician:  Arvella Nigh, MD   Tele-visit due to COVID-19 pandemic Patient requested visit virtually, consented to the virtual encounter via video enabled telemedicine application (Zoom) Contact made at: 09/21/18 11:30 Patient verified by name and date of birth Location of patient: Home Location provider: [facility] Names of persons participating: Me, patient, Tinnie Gens CMA Time spent on telehealth visit: 26 minutes I discussed the limitations of evaluation and management by telemedicine. The patient expressed understanding and agreed to proceed.  Reason for Consultation: Abnormal liver enzymes   IMPRESSION:  Abnormal liver enzymes - elevated ALT Blood in the stool without associated anemia Skin tag as noted source of heme + stools by Dr. Deatra Ina in 2011 Colonoscopy 09/16/18 showed small internal hemorrhoids and a skin tag Sigmoid diverticulosis seen on prior endoscopy No known family history of colon cancer or polyps  Suspected hepatocellular process with fatty liver being most likely. Her frequent blood donation makes HCV or HBV unlikely. Will proceed with labs to evaluate for other common causes of hepatocellular injury including iron, ferritin, fasting insulin, fasting glucose,and autoimmune type I (ANA, AMA, ASMA, IgG). If these labs are non-diagnostic, will proceed with alpha-1-antitrypsin, TSH, and celiac serologies.   PLAN: Abdominal ultrasound Labs: Hepatitis C antibody, hepatitis B surface antigen, hepatitis B core antibody, fasting ferritin, fasting insulin, fasting glucose, iron, ANA, AMA, anti-smooth muscle antibody, IgG, IgM Follow-up 1-2 weeks after the ultrasound to review the results Colonoscopy 2030 for colon cancer screening   HPI: Joanna Velez is a 53 y.o. female referred by Dr. Radene Knee for further evaluation of an elevated ALT.  The internal history is obtained to the patient,  review of her electronic health record, and referral records from Dr. Radene Knee. I recently performed a colonoscopy for blood in the stool that she reported to Dr. Radene Knee at the time of her annual exam. Since her colonoscopy, she has been rerefered for abnormal liver enzymes.  Colonoscopy 09/16/18 showed small internal hemorrhoids were found, a small skin tag, and a 2 mm polyp was found in the distal sigmoid colon that was not a polyp at all. It was benign polypoid colorectal mucosa. Multiple small and large-mouthed diverticula were found in the sigmoid colon and descending colon. A few scattered diverticula were seen in the right colon and transverse Colon.  History of elevated liver enzymes in the last 6 weeks. No history of elevated enzymes in the past. Rechecked this week and it remains elevated. Labs 09/19/2018 show ALT 41, AST 32, alk phos 89, total bilirubin 0.3, direct bilirubin 0.09, albumin 4.2, total protein 6.6.  Prior blood donation 20-30 years ago.  No prior blood transfusion.  No history of jaundice or scleral icterus.  No history of use or experimentation with IV or intranasal street drugs.  No history of autoimmune disease.  No family history of liver disease.  No prior abdominal imaging.   Wieght is 185. Gained 20-30 in the last 5 years.   Rows, walks or some form of exercise every day.     Prior endoscopy: Colonoscopy with Dr. Deatra Ina for heme positive stools 04/18/09:  scattered diverticulosis.  He attributed the heme positive stool to a perianal skin tag.  Repeat colonoscopy recommended in 10 years. Flexible sigmoidoscopy with Dr. Deatra Ina 07/12/2014 to evaluate anorectal bleeding showed mild diverticulosis in the descending colon and internal hemorrhoids. Colonoscopy 09/16/18: internal hemorrhoids, small skin tag, polypoid colorectal mucosa that had the appearance of a polyp,  diverticulosis. Repeat colonoscopy recommended in 10 years.  Past Medical History:  Diagnosis Date  . Blood in  stool   . Diverticulosis     Past Surgical History:  Procedure Laterality Date  . COLONOSCOPY    . WISDOM TOOTH EXTRACTION      Current Outpatient Medications  Medication Sig Dispense Refill  . calcium carbonate (OS-CAL) 600 MG TABS tablet Take 600 mg by mouth daily with breakfast.    . Cyanocobalamin (VITAMIN B12 PO) Take 1 tablet by mouth daily.    Marland Kitchen ECHINACEA PO Take 1 tablet by mouth daily.    . hydrocortisone (ANUSOL-HC) 2.5 % rectal cream Place 1 application rectally 2 (two) times daily. Use x 7 days and re-evaluate 30 g 0  . Multiple Vitamins-Minerals (CENTRUM SILVER ULTRA WOMENS PO) Take 1 tablet by mouth daily.     No current facility-administered medications for this visit.     Allergies as of 09/21/2018  . (No Known Allergies)    Family History  Problem Relation Age of Onset  . Macular degeneration Mother   . Stroke Father   . Gallbladder disease Father   . Diabetes Father   . Heart disease Maternal Grandfather   . Lung cancer Paternal Grandfather   . Alzheimer's disease Paternal Grandmother   . Heart defect Daughter   . Colon cancer Neg Hx   . Colon polyps Neg Hx   . Esophageal cancer Neg Hx   . Stomach cancer Neg Hx   . Rectal cancer Neg Hx     Social History   Socioeconomic History  . Marital status: Married    Spouse name: Not on file  . Number of children: 2  . Years of education: Not on file  . Highest education level: Not on file  Occupational History  . Occupation: Doctor, hospital  . Financial resource strain: Not on file  . Food insecurity    Worry: Not on file    Inability: Not on file  . Transportation needs    Medical: Not on file    Non-medical: Not on file  Tobacco Use  . Smoking status: Former Smoker    Types: Cigarettes    Quit date: 02/15/1993    Years since quitting: 25.6  . Smokeless tobacco: Never Used  Substance and Sexual Activity  . Alcohol use: Yes    Alcohol/week: 2.0 standard drinks    Types: 2 Glasses of  wine per week    Comment: 1-2 per week  . Drug use: No  . Sexual activity: Not on file  Lifestyle  . Physical activity    Days per week: Not on file    Minutes per session: Not on file  . Stress: Not on file  Relationships  . Social Herbalist on phone: Not on file    Gets together: Not on file    Attends religious service: Not on file    Active member of club or organization: Not on file    Attends meetings of clubs or organizations: Not on file    Relationship status: Not on file  . Intimate partner violence    Fear of current or ex partner: Not on file    Emotionally abused: Not on file    Physically abused: Not on file    Forced sexual activity: Not on file  Other Topics Concern  . Not on file  Social History Narrative  . Not on file    Review of Systems:  ALL ROS discussed and all others negative except listed in HPI.  Physical Exam: Complete physical exam not performed due to the limits inherent in a telehealth encounter.  General: Awake, alert, and oriented, and well communicative. In no acute distress.  HEENT: EOMI, non-icteric sclera, NCAT, MMM  Neck: Normal movement of head and neck  Pulm: No labored breathing, speaking in full sentences without conversational dyspnea  Derm: No apparent lesions or bruising in visible field  MS: Moves all visible extremities without noticeable abnormality  Psych: Pleasant, cooperative, normal speech, normal affect and normal insight Neuro: Alert and appropriate   Celvin Taney L. Tarri Glenn, MD, MPH [facility] 09/21/2018, 11:52 AM

## 2018-09-27 ENCOUNTER — Other Ambulatory Visit (INDEPENDENT_AMBULATORY_CARE_PROVIDER_SITE_OTHER): Payer: Managed Care, Other (non HMO)

## 2018-09-27 DIAGNOSIS — R748 Abnormal levels of other serum enzymes: Secondary | ICD-10-CM | POA: Diagnosis not present

## 2018-09-27 LAB — IRON: Iron: 142 ug/dL (ref 42–145)

## 2018-09-27 LAB — GLUCOSE, RANDOM: Glucose, Bld: 106 mg/dL — ABNORMAL HIGH (ref 70–99)

## 2018-09-27 LAB — FERRITIN: Ferritin: 297.7 ng/mL — ABNORMAL HIGH (ref 10.0–291.0)

## 2018-09-28 ENCOUNTER — Other Ambulatory Visit: Payer: Self-pay

## 2018-09-28 ENCOUNTER — Ambulatory Visit (HOSPITAL_COMMUNITY)
Admission: RE | Admit: 2018-09-28 | Discharge: 2018-09-28 | Disposition: A | Discharge status: Home / Self Care | Payer: Managed Care, Other (non HMO) | Source: Ambulatory Visit | Attending: Gastroenterology | Admitting: Gastroenterology

## 2018-09-28 DIAGNOSIS — R748 Abnormal levels of other serum enzymes: Secondary | ICD-10-CM | POA: Diagnosis present

## 2018-09-29 LAB — MITOCHONDRIAL ANTIBODIES: Mitochondrial M2 Ab, IgG: <=20 U

## 2018-09-29 LAB — HEPATITIS B CORE ANTIBODY, TOTAL: Hep B Core Total Ab: NONREACTIVE

## 2018-09-29 LAB — IGM: IgM, Serum: 191 mg/dL (ref 50–300)

## 2018-09-29 LAB — HEPATITIS B SURFACE ANTIGEN: Hepatitis B Surface Ag: NONREACTIVE

## 2018-09-29 LAB — HEPATITIS C ANTIBODY
Hepatitis C Ab: NONREACTIVE
SIGNAL TO CUT-OFF: 0.01 (ref <1.00)

## 2018-09-29 LAB — IGG: IgG (Immunoglobin G), Serum: 1022 mg/dL (ref 600–1640)

## 2018-09-29 LAB — ANTI-SMOOTH MUSCLE ANTIBODY, IGG: Actin (Smooth Muscle) Antibody (IGG): <20 U (ref <20)

## 2018-10-02 ENCOUNTER — Other Ambulatory Visit: Payer: Self-pay | Admitting: *Deleted

## 2018-10-02 DIAGNOSIS — R748 Abnormal levels of other serum enzymes: Secondary | ICD-10-CM

## 2018-10-10 ENCOUNTER — Other Ambulatory Visit (INDEPENDENT_AMBULATORY_CARE_PROVIDER_SITE_OTHER): Payer: Managed Care, Other (non HMO)

## 2018-10-10 DIAGNOSIS — R748 Abnormal levels of other serum enzymes: Secondary | ICD-10-CM

## 2018-10-10 LAB — GLUCOSE, RANDOM: Glucose, Bld: 99 mg/dL (ref 70–99)

## 2018-10-14 LAB — INSULIN, FREE AND TOTAL
Free Insulin: 12 uU/mL
Total Insulin: 12 uU/mL

## 2018-10-30 ENCOUNTER — Ambulatory Visit (INDEPENDENT_AMBULATORY_CARE_PROVIDER_SITE_OTHER): Payer: Managed Care, Other (non HMO) | Admitting: Gastroenterology

## 2018-10-30 ENCOUNTER — Encounter: Payer: Self-pay | Admitting: Gastroenterology

## 2018-10-30 ENCOUNTER — Other Ambulatory Visit: Payer: Self-pay

## 2018-10-30 ENCOUNTER — Other Ambulatory Visit: Payer: Managed Care, Other (non HMO)

## 2018-10-30 VITALS — BP 124/72 | HR 73 | Temp 98.4°F | Ht 63.5 in | Wt 190.0 lb

## 2018-10-30 DIAGNOSIS — R748 Abnormal levels of other serum enzymes: Secondary | ICD-10-CM

## 2018-10-30 DIAGNOSIS — R932 Abnormal findings on diagnostic imaging of liver and biliary tract: Secondary | ICD-10-CM | POA: Diagnosis not present

## 2018-10-30 NOTE — Patient Instructions (Addendum)
I have recommended a NASH Fibrosure and Fibroscan to determine the extent of inflammation and damage to your liver. This is important because it helps [place] know how to help you over time.   Abstain from all alcohol including beer, wine, liquor, and non-alcoholic beer.  Consider drinking 2-3 9 ounce cups of regular brewed coffee every day. Although limit your daily caffeine intake to no more than 400 mg.   Improve sleep hygiene to match sleep and wake times during workdays and weekends.   Sleep at least 7-9 hours every night. Use blackout curtains, turn off lights at bedtime, and limit nighttime use of electronic devices and caffeine.   Work with your doctor to manage your cholesterol and any blood sugar issues.   You may have insulin resistance based on your recent labs and this may require additional evaluation.  Please return to this office in 6-12 months, or earlier with any new symptoms.

## 2018-10-30 NOTE — Progress Notes (Addendum)
Referring Provider: Arvella Nigh, MD Primary Care Physician:  Arvella Nigh, MD   Chief complaint: Abnormal liver enzymes   IMPRESSION:  Suspected fatty liver presenting with elevated ALT     - echogenic liver on ultrasound 09/28/18    - HBsAb neg, HBcAb neg, HCV Ab neg    - normal IgM, IgG, and negative AMA; normal iron, ferritin 297 Skin tag as noted source of heme + stools by Dr. Deatra Ina in 2011 Colonoscopy 09/16/18 showed small internal hemorrhoids and a skin tag Sigmoid diverticulosis seen on prior endoscopy No known family history of colon cancer or polyps   Suspected fatty liver based on recent labs and imaging. Unfortunately, ANA was not obtained but IgG is normal. HOMA IR 2.9 suggests the possibility of insulin resistance.   Reviewed the natural history and treatment options. Information brochure provided. Treatment is currently focused on working towards a healthy weight, regular exercise, and maximizing control of diabetes and cholesterol abnormalities. We discussed avoiding high fructose corn syrup.  Hopefully, medical therapy will be available in the future. There is a known increased risk of cardiovascular disease in the patients with NASH. Therefore,  I am recommending that we stage fatty liver disease in terms of both inflammation and fibrosis to provide adequate care in the future.  PLAN: NASH Fibrosure and Fibroscan Reviewed lifestyle modifications to maximize liver health and minimize disease progression ANA with future labs Review possible diagnosis of insulin resistance with her primary care doctor Return in 6-12 months, or earlier as necessary Colonoscopy 2030 for colon cancer screening   HPI: Joanna Velez is a 53 y.o. female under evaluation of an elevated ALT.  The interval history is obtained through the patient and review of her electronic health record. I recently performed a colonoscopy for blood in the stool that she reported to Dr. Radene Knee at the time of her  annual exam. Since her colonoscopy, she has been under evaluation for an abnormal ALT. No risk factors for chronic viral hepatitis. No history of liver disease. No family history of liver disease. No symptoms associated with the abnormality.   Wieght is 185. Gained 20-30 in the last 5 years.  Admits that her diet could be better. Rows, walks or participates in some form of exercise every day.   Abdominal ultrasound 09/28/18 showed:  No acute abnormality.  No gallstones or biliary distention. Increased hepatic echogenicity.  Recent labs:  Labs 09/19/2018: ALT 41, AST 32, alk phos 89, total bilirubin 0.3, direct bilirubin 0.09, albumin 4.2, total protein 6.6. Labs 09/27/18: glucose 106, iron 142, ferritin 297, IgG 1022, IgM 191, AMA negative, HBsAb NR, HBcAb neg, HCV Ab NR Labs 10/10/18: fasting glucose 99, fasting insulin 12   Prior endoscopy: Colonoscopy with Dr. Deatra Ina for heme positive stools 04/18/09:  scattered diverticulosis.  He attributed the heme positive stool to a perianal skin tag.  Repeat colonoscopy recommended in 10 years. Flexible sigmoidoscopy with Dr. Deatra Ina 07/12/2014 to evaluate anorectal bleeding showed mild diverticulosis in the descending colon and internal hemorrhoids. Colonoscopy 09/16/18: internal hemorrhoids, small skin tag, polypoid colorectal mucosa that had the appearance of a polyp, diverticulosis. Repeat colonoscopy recommended in 10 years.  Past Medical History:  Diagnosis Date  . Blood in stool   . Diverticulosis     Past Surgical History:  Procedure Laterality Date  . COLONOSCOPY    . WISDOM TOOTH EXTRACTION      Current Outpatient Medications  Medication Sig Dispense Refill  . calcium carbonate (OS-CAL) 600 MG TABS  tablet Take 600 mg by mouth daily with breakfast.    . Cyanocobalamin (VITAMIN B12 PO) Take 1 tablet by mouth daily.    Marland Kitchen ECHINACEA PO Take 1 tablet by mouth daily.    . hydrocortisone (ANUSOL-HC) 2.5 % rectal cream Place 1 application rectally 2  (two) times daily. Use x 7 days and re-evaluate (Patient taking differently: Place 1 application rectally 2 (two) times daily. As needed) 30 g 0  . Multiple Vitamins-Minerals (CENTRUM SILVER ULTRA WOMENS PO) Take 1 tablet by mouth daily.     No current facility-administered medications for this visit.     Allergies as of 10/30/2018  . (No Known Allergies)    Family History  Problem Relation Age of Onset  . Macular degeneration Mother   . Stroke Father   . Gallbladder disease Father   . Diabetes Father   . Melanoma Father   . Heart disease Maternal Grandfather   . Lung cancer Paternal Grandfather   . Alzheimer's disease Paternal Grandmother   . Heart defect Daughter   . Colon cancer Neg Hx   . Colon polyps Neg Hx   . Esophageal cancer Neg Hx   . Stomach cancer Neg Hx   . Rectal cancer Neg Hx     Social History   Socioeconomic History  . Marital status: Married    Spouse name: Not on file  . Number of children: 2  . Years of education: Not on file  . Highest education level: Not on file  Occupational History  . Occupation: Doctor, hospital  . Financial resource strain: Not on file  . Food insecurity    Worry: Not on file    Inability: Not on file  . Transportation needs    Medical: Not on file    Non-medical: Not on file  Tobacco Use  . Smoking status: Former Smoker    Types: Cigarettes    Quit date: 02/15/1993    Years since quitting: 25.7  . Smokeless tobacco: Never Used  Substance and Sexual Activity  . Alcohol use: Yes    Alcohol/week: 2.0 standard drinks    Types: 2 Glasses of wine per week    Comment: 1-2 per week  . Drug use: No  . Sexual activity: Not on file  Lifestyle  . Physical activity    Days per week: Not on file    Minutes per session: Not on file  . Stress: Not on file  Relationships  . Social Herbalist on phone: Not on file    Gets together: Not on file    Attends religious service: Not on file    Active member of  club or organization: Not on file    Attends meetings of clubs or organizations: Not on file    Relationship status: Not on file  . Intimate partner violence    Fear of current or ex partner: Not on file    Emotionally abused: Not on file    Physically abused: Not on file    Forced sexual activity: Not on file  Other Topics Concern  . Not on file  Social History Narrative  . Not on file    Physical Exam: Complete physical exam not performed due to the limits inherent in a telehealth encounter.  General: Awake, alert, and oriented, and well communicative. In no acute distress.  HEENT: EOMI, non-icteric sclera, NCAT, MMM  Neck: Normal movement of head and neck  Pulm: No labored breathing, speaking  in full sentences without conversational dyspnea  Derm: No apparent lesions or bruising in visible field  MS: Moves all visible extremities without noticeable abnormality  Psych: Pleasant, cooperative, normal speech, normal affect and normal insight Neuro: Alert and appropriate   Kimberly L. Tarri Glenn, MD, MPH [facility] 10/30/2018, 4:14 PM

## 2018-11-03 ENCOUNTER — Ambulatory Visit (HOSPITAL_COMMUNITY)
Admission: RE | Admit: 2018-11-03 | Discharge: 2018-11-03 | Disposition: A | Discharge status: Home / Self Care | Payer: Managed Care, Other (non HMO) | Source: Ambulatory Visit | Attending: Gastroenterology | Admitting: Gastroenterology

## 2018-11-03 ENCOUNTER — Other Ambulatory Visit: Payer: Self-pay

## 2018-11-03 DIAGNOSIS — R748 Abnormal levels of other serum enzymes: Secondary | ICD-10-CM | POA: Insufficient documentation

## 2018-11-07 LAB — NASH FIBROSURE
ALPHA 2-MACROGLOBULINS, QN: 130 mg/dL (ref 110–276)
ALT (SGPT) P5P: 55 IU/L — ABNORMAL HIGH (ref 0–40)
AST (SGOT) P5P: 44 IU/L — ABNORMAL HIGH (ref 0–40)
Apolipoprotein A-1: 145 mg/dL (ref 116–209)
Bilirubin, Total: 0.3 mg/dL (ref 0.0–1.2)
Cholesterol, Total: 198 mg/dL (ref 100–199)
Fibrosis Score: 0.06 (ref 0.00–0.21)
GGT: 34 IU/L (ref 0–60)
Glucose: 93 mg/dL (ref 65–99)
Haptoglobin: 178 mg/dL (ref 33–346)
Height: 63 in
NASH Score: 0.5 — ABNORMAL HIGH
Steatosis Score: 0.78 — ABNORMAL HIGH (ref 0.00–0.30)
Triglycerides: 180 mg/dL — ABNORMAL HIGH (ref 0–149)
Weight: 190 [lb_av]

## 2018-11-12 DIAGNOSIS — Z833 Family history of diabetes mellitus: Secondary | ICD-10-CM | POA: Insufficient documentation

## 2018-11-12 NOTE — Progress Notes (Signed)
Subjective:    Patient ID: Joanna Velez, female    DOB: 04-25-1965, 53 y.o.   MRN: [id number]  HPI  She is exercising regularly.   She is here for a physical exam.   She has no concerns.    Medications and allergies reviewed with patient and updated if appropriate.  Patient Active Problem List   Diagnosis Date Noted  . Family history of diabetes mellitus in father 11/12/2018  . Rectal bleeding 07/03/2014    Current Outpatient Medications on File Prior to Visit  Medication Sig Dispense Refill  . Calcium-Magnesium-Vitamin D (CALCIUM 1200+D3 PO) Take by mouth.    . Cyanocobalamin (VITAMIN B12 PO) Take 1 tablet by mouth daily.    Marland Kitchen ECHINACEA PO Take 1 tablet by mouth daily.    . hydrocortisone (ANUSOL-HC) 2.5 % rectal cream Place 1 application rectally 2 (two) times daily. Use x 7 days and re-evaluate (Patient taking differently: Place 1 application rectally 2 (two) times daily. As needed) 30 g 0  . Multiple Vitamins-Minerals (CENTRUM SILVER ULTRA WOMENS PO) Take 1 tablet by mouth daily.     No current facility-administered medications on file prior to visit.     Past Medical History:  Diagnosis Date  . Blood in stool   . Diverticulosis   . History of chicken pox     Past Surgical History:  Procedure Laterality Date  . COLONOSCOPY    . WISDOM TOOTH EXTRACTION      Social History   Socioeconomic History  . Marital status: Married    Spouse name: Not on file  . Number of children: 2  . Years of education: Not on file  . Highest education level: Not on file  Occupational History  . Occupation: Museum/gallery conservator  . Financial resource strain: Not on file  . Food insecurity    Worry: Not on file    Inability: Not on file  . Transportation needs    Medical: Not on file    Non-medical: Not on file  Tobacco Use  . Smoking status: Former Smoker    Types: Cigarettes    Quit date: 02/15/1993    Years since quitting: 25.7  . Smokeless tobacco: Never Used   Substance and Sexual Activity  . Alcohol use: Yes    Alcohol/week: 2.0 standard drinks    Types: 2 Glasses of wine per week    Comment: 1-2 per week  . Drug use: No  . Sexual activity: Not on file  Lifestyle  . Physical activity    Days per week: Not on file    Minutes per session: Not on file  . Stress: Not on file  Relationships  . Social Musician on phone: Not on file    Gets together: Not on file    Attends religious service: Not on file    Active member of club or organization: Not on file    Attends meetings of clubs or organizations: Not on file    Relationship status: Not on file  Other Topics Concern  . Not on file  Social History Narrative  . Not on file    Family History  Problem Relation Age of Onset  . Macular degeneration Mother   . Stroke Father   . Gallbladder disease Father   . Diabetes Father   . Melanoma Father   . Heart disease Maternal Grandfather   . Lung cancer Paternal Grandfather   . Alzheimer's disease Paternal Grandmother   .  Heart defect Daughter   . Colon cancer Neg Hx   . Colon polyps Neg Hx   . Esophageal cancer Neg Hx   . Stomach cancer Neg Hx   . Rectal cancer Neg Hx     Review of Systems  Constitutional: Negative for chills and fever.  Eyes: Negative for visual disturbance.  Respiratory: Negative for cough, shortness of breath and wheezing.   Cardiovascular: Positive for chest pain (from GERD). Negative for palpitations and leg swelling.  Gastrointestinal: Positive for anal bleeding (hemhorrhoidal). Negative for abdominal pain, blood in stool, constipation, diarrhea and nausea.       GERD- depends on what she eats  Genitourinary: Negative for dysuria and hematuria.  Musculoskeletal: Positive for arthralgias.  Skin: Negative for color change and rash.  Neurological: Negative for dizziness, light-headedness and headaches.  Psychiatric/Behavioral: Negative for dysphoric mood. The patient is not nervous/anxious.         Objective:   Vitals:   11/13/18 1451  BP: 122/68  Pulse: 83  Resp: 16  Temp: 98.5 F (36.9 C)  SpO2: 98%   Filed Weights   11/13/18 1451  Weight: 191 lb (86.6 kg)   Body mass index is 33.3 kg/m.  BP Readings from Last 3 Encounters:  11/13/18 122/68  10/30/18 124/72  09/16/18 124/62    Wt Readings from Last 3 Encounters:  11/13/18 191 lb (86.6 kg)  10/30/18 190 lb (86.2 kg)  09/16/18 175 lb (79.4 kg)     Physical Exam Constitutional: She appears well-developed and well-nourished. No distress.  HENT:  Head: Normocephalic and atraumatic.  Right Ear: External ear normal. Normal ear canal and TM Left Ear: External ear normal.  Normal ear canal and TM Mouth/Throat: Oropharynx is clear and moist.  Eyes: Conjunctivae and EOM are normal.  Neck: Neck supple. No tracheal deviation present. No thyromegaly present.  No carotid bruit  Cardiovascular: Normal rate, regular rhythm and normal heart sounds.   No murmur heard.  No edema. Pulmonary/Chest: Effort normal and breath sounds normal. No respiratory distress. She has no wheezes. She has no rales.  Breast: deferred   Abdominal: Soft. She exhibits no distension. There is no tenderness.  Lymphadenopathy: She has no cervical adenopathy.  Skin: Skin is warm and dry. She is not diaphoretic.  Psychiatric: She has a normal mood and affect. Her behavior is normal.        Assessment & Plan:   Physical exam: Screening blood work    ordered Immunizations  Flu vaccine today, tdap up to date, discussed shingrix Colonoscopy  Up to date  Mammogram  Up to date  Gyn  Up to date  Eye exams  Up to date  Exercise  Row machines, weights Weight  Advised weight loss - knows she needs to lose weight+ Substance abuse  None Sees derm annually  See Problem List for Assessment and Plan of chronic medical problems.

## 2018-11-13 ENCOUNTER — Ambulatory Visit (INDEPENDENT_AMBULATORY_CARE_PROVIDER_SITE_OTHER): Payer: Managed Care, Other (non HMO) | Admitting: Internal Medicine

## 2018-11-13 ENCOUNTER — Encounter: Payer: Self-pay | Admitting: Internal Medicine

## 2018-11-13 ENCOUNTER — Other Ambulatory Visit: Payer: Self-pay

## 2018-11-13 VITALS — BP 122/68 | HR 83 | Temp 98.5°F | Resp 16 | Ht 63.5 in | Wt 191.0 lb

## 2018-11-13 DIAGNOSIS — K760 Fatty (change of) liver, not elsewhere classified: Secondary | ICD-10-CM | POA: Diagnosis not present

## 2018-11-13 DIAGNOSIS — Z833 Family history of diabetes mellitus: Secondary | ICD-10-CM

## 2018-11-13 DIAGNOSIS — K5730 Diverticulosis of large intestine without perforation or abscess without bleeding: Secondary | ICD-10-CM | POA: Insufficient documentation

## 2018-11-13 DIAGNOSIS — Z23 Encounter for immunization: Secondary | ICD-10-CM

## 2018-11-13 DIAGNOSIS — K648 Other hemorrhoids: Secondary | ICD-10-CM

## 2018-11-13 NOTE — Patient Instructions (Signed)
All other Health Maintenance issues reviewed.   All recommended immunizations and age-appropriate screenings are up-to-date or discussed.  Flu immunization administered today.    Medications reviewed and updated.  Changes include :  none     Health Maintenance, Female Adopting a healthy lifestyle and getting preventive care are important in promoting health and wellness. Ask your health care provider about:  The right schedule for you to have regular tests and exams.  Things you can do on your own to prevent diseases and keep yourself healthy. What should I know about diet, weight, and exercise? Eat a healthy diet   Eat a diet that includes plenty of vegetables, fruits, low-fat dairy products, and lean protein.  Do not eat a lot of foods that are high in solid fats, added sugars, or sodium. Maintain a healthy weight Body mass index (BMI) is used to identify weight problems. It estimates body fat based on height and weight. Your health care provider can help determine your BMI and help you achieve or maintain a healthy weight. Get regular exercise Get regular exercise. This is one of the most important things you can do for your health. Most adults should:  Exercise for at least 150 minutes each week. The exercise should increase your heart rate and make you sweat (moderate-intensity exercise).  Do strengthening exercises at least twice a week. This is in addition to the moderate-intensity exercise.  Spend less time sitting. Even light physical activity can be beneficial. Watch cholesterol and blood lipids Have your blood tested for lipids and cholesterol at 55 years of age, then have this test every 5 years. Have your cholesterol levels checked more often if:  Your lipid or cholesterol levels are high.  You are older than 55 years of age.  You are at high risk for heart disease. What should I know about cancer screening? Depending on your health history and family history,  you may need to have cancer screening at various ages. This may include screening for:  Breast cancer.  Cervical cancer.  Colorectal cancer.  Skin cancer.  Lung cancer. What should I know about heart disease, diabetes, and high blood pressure? Blood pressure and heart disease  High blood pressure causes heart disease and increases the risk of stroke. This is more likely to develop in people who have high blood pressure readings, are of African descent, or are overweight.  Have your blood pressure checked: ? Every 3-5 years if you are 61-60 years of age. ? Every year if you are 67 years old or older. Diabetes Have regular diabetes screenings. This checks your fasting blood sugar level. Have the screening done:  Once every three years after age 55 if you are at a normal weight and have a low risk for diabetes.  More often and at a younger age if you are overweight or have a high risk for diabetes. What should I know about preventing infection? Hepatitis B If you have a higher risk for hepatitis B, you should be screened for this virus. Talk with your health care provider to find out if you are at risk for hepatitis B infection. Hepatitis C Testing is recommended for:  Everyone born from [birth year] through 1965.  Anyone with known risk factors for hepatitis C. Sexually transmitted infections (STIs)  Get screened for STIs, including gonorrhea and chlamydia, if: ? You are sexually active and are younger than 55 years of age. ? You are older than 55 years of age and your health care  provider tells you that you are at risk for this type of infection. ? Your sexual activity has changed since you were last screened, and you are at increased risk for chlamydia or gonorrhea. Ask your health care provider if you are at risk.  Ask your health care provider about whether you are at high risk for HIV. Your health care provider may recommend a prescription medicine to help prevent HIV infection.  If you choose to take medicine to prevent HIV, you should first get tested for HIV. You should then be tested every 3 months for as long as you are taking the medicine. Pregnancy  If you are about to stop having your period (premenopausal) and you may become pregnant, seek counseling before you get pregnant.  Take 400 to 800 micrograms (mcg) of folic acid every day if you become pregnant.  Ask for birth control (contraception) if you want to prevent pregnancy. Osteoporosis and menopause Osteoporosis is a disease in which the bones lose minerals and strength with aging. This can result in bone fractures. If you are 67 years old or older, or if you are at risk for osteoporosis and fractures, ask your health care provider if you should:  Be screened for bone loss.  Take a calcium or vitamin D supplement to lower your risk of fractures.  Be given hormone replacement therapy (HRT) to treat symptoms of menopause. Follow these instructions at home: Lifestyle  Do not use any products that contain nicotine or tobacco, such as cigarettes, e-cigarettes, and chewing tobacco. If you need help quitting, ask your health care provider.  Do not use street drugs.  Do not share needles.  Ask your health care provider for help if you need support or information about quitting drugs. Alcohol use  Do not drink alcohol if: ? Your health care provider tells you not to drink. ? You are pregnant, may be pregnant, or are planning to become pregnant.  If you drink alcohol: ? Limit how much you use to 0-1 drink a day. ? Limit intake if you are breastfeeding.  Be aware of how much alcohol is in your drink. In the [place] one drink equals one 12 oz bottle of beer (355 mL), one 5 oz glass of wine (148 mL), or one 1 oz glass of hard liquor (44 mL). General instructions  Schedule regular health, dental, and eye exams.  Stay current with your vaccines.  Tell your health care provider if: ? You often feel  depressed. ? You have ever been abused or do not feel safe at home. Summary  Adopting a healthy lifestyle and getting preventive care are important in promoting health and wellness.  Follow your health care provider's instructions about healthy diet, exercising, and getting tested or screened for diseases.  Follow your health care provider's instructions on monitoring your cholesterol and blood pressure. This information is not intended to replace advice given to you by your health care provider. Make sure you discuss any questions you have with your health care provider. Document Released: 08/17/2010 Document Revised: 01/25/2018 Document Reviewed: 01/25/2018 Elsevier Patient Education  2020 [employer].

## 2018-11-13 NOTE — Assessment & Plan Note (Signed)
Discussed risk of diabetes Has had blood work this summer - advised getting an a1c with next blood work Will work on [employer] loss Exercises regularly

## 2018-11-13 NOTE — Assessment & Plan Note (Signed)
Colonoscopy up to date anusol-hc prn

## 2018-11-13 NOTE — Assessment & Plan Note (Signed)
Following with GI Working on weight loss

## 2019-09-26 LAB — HM MAMMOGRAPHY

## 2020-03-18 NOTE — Progress Notes (Signed)
Subjective:    Patient ID: Joanna Velez, female    DOB: 1966-01-19, 55 y.o.   MRN: [id number]   This visit occurred during the SARS-CoV-2 public health emergency.  Safety protocols were in place, including screening questions prior to the visit, additional usage of staff PPE, and extensive cleaning of exam room while observing appropriate contact time as indicated for disinfecting solutions.    HPI She is here for a physical exam.     Medications and allergies reviewed with patient and updated if appropriate.  Patient Active Problem List   Diagnosis Date Noted  . Fatty liver 11/13/2018  . Internal hemorrhoids 11/13/2018  . Diverticulosis of colon 11/13/2018  . Family history of diabetes mellitus in father 11/12/2018    Current Outpatient Medications on File Prior to Visit  Medication Sig Dispense Refill  . Calcium-Magnesium-Vitamin D (CALCIUM 1200+D3 PO) Take by mouth.    . Cyanocobalamin (VITAMIN B12 PO) Take 1 tablet by mouth daily.    Marland Kitchen ECHINACEA PO Take 1 tablet by mouth daily.    . hydrocortisone (ANUSOL-HC) 2.5 % rectal cream Place 1 application rectally 2 (two) times daily. Use x 7 days and re-evaluate (Patient taking differently: Place 1 application rectally 2 (two) times daily. As needed) 30 g 0  . Multiple Vitamins-Minerals (CENTRUM SILVER ULTRA WOMENS PO) Take 1 tablet by mouth daily.     No current facility-administered medications on file prior to visit.    Past Medical History:  Diagnosis Date  . Blood in stool   . Diverticulosis   . History of chicken pox     Past Surgical History:  Procedure Laterality Date  . COLONOSCOPY    . WISDOM TOOTH EXTRACTION      Social History   Socioeconomic History  . Marital status: Married    Spouse name: Not on file  . Number of children: 2  . Years of education: Not on file  . Highest education level: Not on file  Occupational History  . Occupation: Chief Financial Officer  Tobacco Use  . Smoking status: Former Smoker     Types: Cigarettes    Quit date: 02/15/1993    Years since quitting: 27.1  . Smokeless tobacco: Never Used  Vaping Use  . Vaping Use: Never used  Substance and Sexual Activity  . Alcohol use: Yes    Alcohol/week: 2.0 standard drinks    Types: 2 Glasses of wine per week    Comment: 1-2 per week  . Drug use: No  . Sexual activity: Not on file  Other Topics Concern  . Not on file  Social History Narrative  . Not on file   Social Determinants of Health   Financial Resource Strain: Not on file  Food Insecurity: Not on file  Transportation Needs: Not on file  Physical Activity: Not on file  Stress: Not on file  Social Connections: Not on file    Family History  Problem Relation Age of Onset  . Macular degeneration Mother   . Dementia Mother   . Stroke Father   . Gallbladder disease Father   . Diabetes Father   . Melanoma Father   . Heart disease Maternal Grandfather   . Lung cancer Paternal Grandfather   . Alzheimer's disease Paternal Grandmother   . Heart defect Daughter   . Colon cancer Neg Hx   . Colon polyps Neg Hx   . Esophageal cancer Neg Hx   . Stomach cancer Neg Hx   . Rectal cancer Neg  Hx     Review of Systems     Objective:  There were no vitals filed for this visit. There were no vitals filed for this visit. There is no height or weight on file to calculate BMI.  BP Readings from Last 3 Encounters:  11/13/18 122/68  10/30/18 124/72  09/16/18 124/62    Wt Readings from Last 3 Encounters:  11/13/18 191 lb (86.6 kg)  10/30/18 190 lb (86.2 kg)  09/16/18 175 lb (79.4 kg)     Physical Exam Constitutional: She appears well-developed and well-nourished. No distress.  HENT:  Head: Normocephalic and atraumatic.  Right Ear: External ear normal. Normal ear canal and TM Left Ear: External ear normal.  Normal ear canal and TM Mouth/Throat: Oropharynx is clear and moist.  Eyes: Conjunctivae and EOM are normal.  Neck: Neck supple. No tracheal deviation  present. No thyromegaly present.  No carotid bruit  Cardiovascular: Normal rate, regular rhythm and normal heart sounds.   No murmur heard.  No edema. Pulmonary/Chest: Effort normal and breath sounds normal. No respiratory distress. She has no wheezes. She has no rales.  Breast: deferred   Abdominal: Soft. She exhibits no distension. There is no tenderness.  Lymphadenopathy: She has no cervical adenopathy.  Skin: Skin is warm and dry. She is not diaphoretic.  Psychiatric: She has a normal mood and affect. Her behavior is normal.        Assessment & Plan:   Physical exam: Screening blood work    ordered Immunizations     Discussed shingrix Colonoscopy  Up to date  Mammogram   Gyn   Dexa   Eye exams   Exercise   Weight   Substance abuse        See Problem List for Assessment and Plan of chronic medical problems.      This encounter was created in error - please disregard.

## 2020-03-18 NOTE — Patient Instructions (Signed)
Blood work was ordered.     No immunization administered today.   Medications changes include :     Your prescription(s) have been submitted to your pharmacy.    A referral was ordered for      Someone will call you to schedule an appointment.    Please followup in 1 year    Health Maintenance, Female Adopting a healthy lifestyle and getting preventive care are important in promoting health and wellness. Ask your health care provider about:  The right schedule for you to have regular tests and exams.  Things you can do on your own to prevent diseases and keep yourself healthy. What should I know about diet, weight, and exercise? Eat a healthy diet  Eat a diet that includes plenty of vegetables, fruits, low-fat dairy products, and lean protein.  Do not eat a lot of foods that are high in solid fats, added sugars, or sodium.   Maintain a healthy weight Body mass index (BMI) is used to identify weight problems. It estimates body fat based on height and weight. Your health care provider can help determine your BMI and help you achieve or maintain a healthy weight. Get regular exercise Get regular exercise. This is one of the most important things you can do for your health. Most adults should:  Exercise for at least 150 minutes each week. The exercise should increase your heart rate and make you sweat (moderate-intensity exercise).  Do strengthening exercises at least twice a week. This is in addition to the moderate-intensity exercise.  Spend less time sitting. Even light physical activity can be beneficial. Watch cholesterol and blood lipids Have your blood tested for lipids and cholesterol at 57 years of age, then have this test every 5 years. Have your cholesterol levels checked more often if:  Your lipid or cholesterol levels are high.  You are older than 57 years of age.  You are at high risk for heart disease. What should I know about cancer screening? Depending  on your health history and family history, you may need to have cancer screening at various ages. This may include screening for:  Breast cancer.  Cervical cancer.  Colorectal cancer.  Skin cancer.  Lung cancer. What should I know about heart disease, diabetes, and high blood pressure? Blood pressure and heart disease  High blood pressure causes heart disease and increases the risk of stroke. This is more likely to develop in people who have high blood pressure readings, are of African descent, or are overweight.  Have your blood pressure checked: ? Every 3-5 years if you are 73-41 years of age. ? Every year if you are 74 years old or older. Diabetes Have regular diabetes screenings. This checks your fasting blood sugar level. Have the screening done:  Once every three years after age 21 if you are at a normal weight and have a low risk for diabetes.  More often and at a younger age if you are overweight or have a high risk for diabetes. What should I know about preventing infection? Hepatitis B If you have a higher risk for hepatitis B, you should be screened for this virus. Talk with your health care provider to find out if you are at risk for hepatitis B infection. Hepatitis C Testing is recommended for:  Everyone born from [birth year] through 1965.  Anyone with known risk factors for hepatitis C. Sexually transmitted infections (STIs)  Get screened for STIs, including gonorrhea and chlamydia, if: ? You are  sexually active and are younger than 57 years of age. ? You are older than 57 years of age and your health care provider tells you that you are at risk for this type of infection. ? Your sexual activity has changed since you were last screened, and you are at increased risk for chlamydia or gonorrhea. Ask your health care provider if you are at risk.  Ask your health care provider about whether you are at high risk for HIV. Your health care provider may recommend a  prescription medicine to help prevent HIV infection. If you choose to take medicine to prevent HIV, you should first get tested for HIV. You should then be tested every 3 months for as long as you are taking the medicine. Pregnancy  If you are about to stop having your period (premenopausal) and you may become pregnant, seek counseling before you get pregnant.  Take 400 to 800 micrograms (mcg) of folic acid every day if you become pregnant.  Ask for birth control (contraception) if you want to prevent pregnancy. Osteoporosis and menopause Osteoporosis is a disease in which the bones lose minerals and strength with aging. This can result in bone fractures. If you are 70 years old or older, or if you are at risk for osteoporosis and fractures, ask your health care provider if you should:  Be screened for bone loss.  Take a calcium or vitamin D supplement to lower your risk of fractures.  Be given hormone replacement therapy (HRT) to treat symptoms of menopause. Follow these instructions at home: Lifestyle  Do not use any products that contain nicotine or tobacco, such as cigarettes, e-cigarettes, and chewing tobacco. If you need help quitting, ask your health care provider.  Do not use street drugs.  Do not share needles.  Ask your health care provider for help if you need support or information about quitting drugs. Alcohol use  Do not drink alcohol if: ? Your health care provider tells you not to drink. ? You are pregnant, may be pregnant, or are planning to become pregnant.  If you drink alcohol: ? Limit how much you use to 0-1 drink a day. ? Limit intake if you are breastfeeding.  Be aware of how much alcohol is in your drink. In the [place] one drink equals one 12 oz bottle of beer (355 mL), one 5 oz glass of wine (148 mL), or one 1 oz glass of hard liquor (44 mL). General instructions  Schedule regular health, dental, and eye exams.  Stay current with your  vaccines.  Tell your health care provider if: ? You often feel depressed. ? You have ever been abused or do not feel safe at home. Summary  Adopting a healthy lifestyle and getting preventive care are important in promoting health and wellness.  Follow your health care provider's instructions about healthy diet, exercising, and getting tested or screened for diseases.  Follow your health care provider's instructions on monitoring your cholesterol and blood pressure. This information is not intended to replace advice given to you by your health care provider. Make sure you discuss any questions you have with your health care provider. Document Revised: 01/25/2018 Document Reviewed: 01/25/2018 Elsevier Patient Education  2021 [employer].

## 2020-03-19 ENCOUNTER — Encounter: Payer: Self-pay | Admitting: Internal Medicine

## 2020-03-19 DIAGNOSIS — Z833 Family history of diabetes mellitus: Secondary | ICD-10-CM

## 2020-03-19 DIAGNOSIS — Z0000 Encounter for general adult medical examination without abnormal findings: Secondary | ICD-10-CM

## 2020-03-23 DIAGNOSIS — E66811 Obesity, class 1: Secondary | ICD-10-CM | POA: Insufficient documentation

## 2020-03-23 DIAGNOSIS — E669 Obesity, unspecified: Secondary | ICD-10-CM | POA: Insufficient documentation

## 2020-03-23 NOTE — Patient Instructions (Addendum)
Blood work was ordered.     Medications changes include :   none     Please followup in 1 year    Health Maintenance, Female Adopting a healthy lifestyle and getting preventive care are important in promoting health and wellness. Ask your health care provider about:  The right schedule for you to have regular tests and exams.  Things you can do on your own to prevent diseases and keep yourself healthy. What should I know about diet, weight, and exercise? Eat a healthy diet  Eat a diet that includes plenty of vegetables, fruits, low-fat dairy products, and lean protein.  Do not eat a lot of foods that are high in solid fats, added sugars, or sodium.   Maintain a healthy weight Body mass index (BMI) is used to identify weight problems. It estimates body fat based on height and weight. Your health care provider can help determine your BMI and help you achieve or maintain a healthy weight. Get regular exercise Get regular exercise. This is one of the most important things you can do for your health. Most adults should:  Exercise for at least 150 minutes each week. The exercise should increase your heart rate and make you sweat (moderate-intensity exercise).  Do strengthening exercises at least twice a week. This is in addition to the moderate-intensity exercise.  Spend less time sitting. Even light physical activity can be beneficial. Watch cholesterol and blood lipids Have your blood tested for lipids and cholesterol at 57 years of age, then have this test every 5 years. Have your cholesterol levels checked more often if:  Your lipid or cholesterol levels are high.  You are older than 57 years of age.  You are at high risk for heart disease. What should I know about cancer screening? Depending on your health history and family history, you may need to have cancer screening at various ages. This may include screening for:  Breast cancer.  Cervical cancer.  Colorectal  cancer.  Skin cancer.  Lung cancer. What should I know about heart disease, diabetes, and high blood pressure? Blood pressure and heart disease  High blood pressure causes heart disease and increases the risk of stroke. This is more likely to develop in people who have high blood pressure readings, are of African descent, or are overweight.  Have your blood pressure checked: ? Every 3-5 years if you are 18-39 years of age. ? Every year if you are 40 years old or older. Diabetes Have regular diabetes screenings. This checks your fasting blood sugar level. Have the screening done:  Once every three years after age 40 if you are at a normal weight and have a low risk for diabetes.  More often and at a younger age if you are overweight or have a high risk for diabetes. What should I know about preventing infection? Hepatitis B If you have a higher risk for hepatitis B, you should be screened for this virus. Talk with your health care provider to find out if you are at risk for hepatitis B infection. Hepatitis C Testing is recommended for:  Everyone born from 1945 through 1965.  Anyone with known risk factors for hepatitis C. Sexually transmitted infections (STIs)  Get screened for STIs, including gonorrhea and chlamydia, if: ? You are sexually active and are younger than 57 years of age. ? You are older than 57 years of age and your health care provider tells you that you are at risk for this type of   infection. ? Your sexual activity has changed since you were last screened, and you are at increased risk for chlamydia or gonorrhea. Ask your health care provider if you are at risk.  Ask your health care provider about whether you are at high risk for HIV. Your health care provider may recommend a prescription medicine to help prevent HIV infection. If you choose to take medicine to prevent HIV, you should first get tested for HIV. You should then be tested every 3 months for as long as  you are taking the medicine. Pregnancy  If you are about to stop having your period (premenopausal) and you may become pregnant, seek counseling before you get pregnant.  Take 400 to 800 micrograms (mcg) of folic acid every day if you become pregnant.  Ask for birth control (contraception) if you want to prevent pregnancy. Osteoporosis and menopause Osteoporosis is a disease in which the bones lose minerals and strength with aging. This can result in bone fractures. If you are 65 years old or older, or if you are at risk for osteoporosis and fractures, ask your health care provider if you should:  Be screened for bone loss.  Take a calcium or vitamin D supplement to lower your risk of fractures.  Be given hormone replacement therapy (HRT) to treat symptoms of menopause. Follow these instructions at home: Lifestyle  Do not use any products that contain nicotine or tobacco, such as cigarettes, e-cigarettes, and chewing tobacco. If you need help quitting, ask your health care provider.  Do not use street drugs.  Do not share needles.  Ask your health care provider for help if you need support or information about quitting drugs. Alcohol use  Do not drink alcohol if: ? Your health care provider tells you not to drink. ? You are pregnant, may be pregnant, or are planning to become pregnant.  If you drink alcohol: ? Limit how much you use to 0-1 drink a day. ? Limit intake if you are breastfeeding.  Be aware of how much alcohol is in your drink. In the [place] one drink equals one 12 oz bottle of beer (355 mL), one 5 oz glass of wine (148 mL), or one 1 oz glass of hard liquor (44 mL). General instructions  Schedule regular health, dental, and eye exams.  Stay current with your vaccines.  Tell your health care provider if: ? You often feel depressed. ? You have ever been abused or do not feel safe at home. Summary  Adopting a healthy lifestyle and getting preventive care are  important in promoting health and wellness.  Follow your health care provider's instructions about healthy diet, exercising, and getting tested or screened for diseases.  Follow your health care provider's instructions on monitoring your cholesterol and blood pressure. This information is not intended to replace advice given to you by your health care provider. Make sure you discuss any questions you have with your health care provider. Document Revised: 01/25/2018 Document Reviewed: 01/25/2018 Elsevier Patient Education  2021 Elsevier Inc.  

## 2020-03-23 NOTE — Progress Notes (Unsigned)
Subjective:    Patient ID: Joanna Velez, female    DOB: May 24, 1965, 55 y.o.   MRN: [id number]   This visit occurred during the SARS-CoV-2 public health emergency.  Safety protocols were in place, including screening questions prior to the visit, additional usage of staff PPE, and extensive cleaning of exam room while observing appropriate contact time as indicated for disinfecting solutions.    HPI She is here for a physical exam.     Medications and allergies reviewed with patient and updated if appropriate.  Patient Active Problem List   Diagnosis Date Noted  . Obesity (BMI 30.0-34.9) 03/23/2020  . Fatty liver 11/13/2018  . Internal hemorrhoids 11/13/2018  . Diverticulosis of colon 11/13/2018  . Family history of diabetes mellitus in father 11/12/2018    Current Outpatient Medications on File Prior to Visit  Medication Sig Dispense Refill  . Calcium-Magnesium-Vitamin D (CALCIUM 1200+D3 PO) Take by mouth.    . Cyanocobalamin (VITAMIN B12 PO) Take 1 tablet by mouth daily.    Marland Kitchen ECHINACEA PO Take 1 tablet by mouth daily.    . Multiple Vitamins-Minerals (CENTRUM SILVER ULTRA WOMENS PO) Take 1 tablet by mouth daily.     No current facility-administered medications on file prior to visit.    Past Medical History:  Diagnosis Date  . Blood in stool   . Diverticulosis   . History of chicken pox     Past Surgical History:  Procedure Laterality Date  . COLONOSCOPY    . WISDOM TOOTH EXTRACTION      Social History   Socioeconomic History  . Marital status: Married    Spouse name: Not on file  . Number of children: 2  . Years of education: Not on file  . Highest education level: Not on file  Occupational History  . Occupation: Chief Financial Officer  Tobacco Use  . Smoking status: Former Smoker    Types: Cigarettes    Quit date: 02/15/1993    Years since quitting: 27.1  . Smokeless tobacco: Never Used  Vaping Use  . Vaping Use: Never used  Substance and Sexual Activity  .  Alcohol use: Yes    Alcohol/week: 2.0 standard drinks    Types: 2 Glasses of wine per week    Comment: 1-2 per week  . Drug use: No  . Sexual activity: Not on file  Other Topics Concern  . Not on file  Social History Narrative  . Not on file   Social Determinants of Health   Financial Resource Strain: Not on file  Food Insecurity: Not on file  Transportation Needs: Not on file  Physical Activity: Not on file  Stress: Not on file  Social Connections: Not on file    Family History  Problem Relation Age of Onset  . Macular degeneration Mother   . Dementia Mother   . Stroke Father   . Gallbladder disease Father   . Diabetes Father   . Melanoma Father   . Heart disease Maternal Grandfather   . Lung cancer Paternal Grandfather   . Alzheimer's disease Paternal Grandmother   . Heart defect Daughter   . Colon cancer Neg Hx   . Colon polyps Neg Hx   . Esophageal cancer Neg Hx   . Stomach cancer Neg Hx   . Rectal cancer Neg Hx     Review of Systems  Constitutional: Negative for chills and fever.  Eyes: Negative for visual disturbance.  Respiratory: Negative for cough, shortness of breath and wheezing.  Cardiovascular: Negative for chest pain (tightness on occ - anxiety or reflux - at night laying in bed), palpitations and leg swelling.  Gastrointestinal: Positive for anal bleeding (hemorrhoidal - typically after running). Negative for abdominal pain, blood in stool, constipation, diarrhea and nausea.       Occ gerd  Genitourinary: Negative for dysuria and hematuria.  Musculoskeletal: Positive for neck pain. Negative for arthralgias and back pain.  Skin: Negative for color change and rash.  Neurological: Positive for headaches (from neck, muscle tightness). Negative for dizziness.  Psychiatric/Behavioral: Positive for sleep disturbance (interrupted). Negative for dysphoric mood. The patient is not nervous/anxious.        Objective:   Vitals:   03/27/20 0851  BP: 114/72   Pulse: 71  Temp: 98.2 F (36.8 C)  SpO2: 97%   Filed Weights   03/27/20 0851  Weight: 176 lb (79.8 kg)   Body mass index is 30.69 kg/m.  BP Readings from Last 3 Encounters:  03/27/20 114/72  11/13/18 122/68  10/30/18 124/72    Wt Readings from Last 3 Encounters:  03/27/20 176 lb (79.8 kg)  11/13/18 191 lb (86.6 kg)  10/30/18 190 lb (86.2 kg)   Depression screen [facility] 2/9 03/27/2020 11/13/2018  Decreased Interest 0 0  Down, Depressed, Hopeless 0 0  PHQ - 2 Score 0 0  Altered sleeping 1 -  Tired, decreased energy 0 -  Change in appetite 0 -  Feeling bad or failure about yourself  0 -  Trouble concentrating 0 -  Moving slowly or fidgety/restless 0 -  Suicidal thoughts 0 -  PHQ-9 Score 1 -  Difficult doing work/chores Not difficult at all -      Physical Exam Constitutional: She appears well-developed and well-nourished. No distress.  HENT:  Head: Normocephalic and atraumatic.  Right Ear: External ear normal. Normal ear canal and TM Left Ear: External ear normal.  Normal ear canal and TM Mouth/Throat: Oropharynx is clear and moist.  Eyes: Conjunctivae and EOM are normal.  Neck: Neck supple. No tracheal deviation present. No thyromegaly present.  No carotid bruit  Cardiovascular: Normal rate, regular rhythm and normal heart sounds.   No murmur heard.  No edema. Pulmonary/Chest: Effort normal and breath sounds normal. No respiratory distress. She has no wheezes. She has no rales.  Breast: deferred   Abdominal: Soft. She exhibits no distension. There is no tenderness.  Lymphadenopathy: She has no cervical adenopathy.  Skin: Skin is warm and dry. She is not diaphoretic.  Psychiatric: She has a normal mood and affect. Her behavior is normal.        Assessment & Plan:   Physical exam: Screening blood work    ordered Immunizations  discussed shingrix Colonoscopy  Up to date  Mammogram  Up to date  Gyn  Dr Arelia Sneddon Eye exams  Due - will schedule Exercise  regular   Weight  Has lost weight - cut back on calories Substance abuse  none Sees derm   annually   Screened for depression using the PHQ 9 scale.  No evidence of depression.    See Problem List for Assessment and Plan of chronic medical problems.

## 2020-03-27 ENCOUNTER — Encounter: Payer: Self-pay | Admitting: Internal Medicine

## 2020-03-27 ENCOUNTER — Ambulatory Visit (INDEPENDENT_AMBULATORY_CARE_PROVIDER_SITE_OTHER): Payer: PRIVATE HEALTH INSURANCE | Admitting: Internal Medicine

## 2020-03-27 ENCOUNTER — Other Ambulatory Visit: Payer: Self-pay

## 2020-03-27 VITALS — BP 114/72 | HR 71 | Temp 98.2°F | Ht 63.5 in | Wt 176.0 lb

## 2020-03-27 DIAGNOSIS — Z1331 Encounter for screening for depression: Secondary | ICD-10-CM

## 2020-03-27 DIAGNOSIS — K760 Fatty (change of) liver, not elsewhere classified: Secondary | ICD-10-CM | POA: Diagnosis not present

## 2020-03-27 DIAGNOSIS — Z0000 Encounter for general adult medical examination without abnormal findings: Secondary | ICD-10-CM | POA: Diagnosis not present

## 2020-03-27 DIAGNOSIS — Z833 Family history of diabetes mellitus: Secondary | ICD-10-CM

## 2020-03-27 DIAGNOSIS — E669 Obesity, unspecified: Secondary | ICD-10-CM | POA: Diagnosis not present

## 2020-03-27 LAB — HEMOGLOBIN A1C: Hgb A1c MFr Bld: 4.9 % (ref 4.6–6.5)

## 2020-03-27 LAB — COMPREHENSIVE METABOLIC PANEL
ALT: 18 U/L (ref 0–35)
AST: 18 U/L (ref 0–37)
Albumin: 4 g/dL (ref 3.5–5.2)
Alkaline Phosphatase: 64 U/L (ref 39–117)
BUN: 17 mg/dL (ref 6–23)
CO2: 28 mEq/L (ref 19–32)
Calcium: 9.3 mg/dL (ref 8.4–10.5)
Chloride: 104 mEq/L (ref 96–112)
Creatinine, Ser: 0.83 mg/dL (ref 0.40–1.20)
GFR: 78.46 mL/min (ref >60.00)
Glucose, Bld: 89 mg/dL (ref 70–99)
Potassium: 4.3 mEq/L (ref 3.5–5.1)
Sodium: 139 mEq/L (ref 135–145)
Total Bilirubin: 0.4 mg/dL (ref 0.2–1.2)
Total Protein: 6.8 g/dL (ref 6.0–8.3)

## 2020-03-27 LAB — CBC WITH DIFFERENTIAL/PLATELET
Basophils Absolute: 0 10*3/uL (ref 0.0–0.1)
Basophils Relative: 0.6 % (ref 0.0–3.0)
Eosinophils Absolute: 0.1 10*3/uL (ref 0.0–0.7)
Eosinophils Relative: 1.8 % (ref 0.0–5.0)
HCT: 41.6 % (ref 36.0–46.0)
Hemoglobin: 14.6 g/dL (ref 12.0–15.0)
Lymphocytes Relative: 38.1 % (ref 12.0–46.0)
Lymphs Abs: 2.6 10*3/uL (ref 0.7–4.0)
MCHC: 35.1 g/dL (ref 30.0–36.0)
MCV: 99 fl (ref 78.0–100.0)
Monocytes Absolute: 0.5 10*3/uL (ref 0.1–1.0)
Monocytes Relative: 7.7 % (ref 3.0–12.0)
Neutro Abs: 3.6 10*3/uL (ref 1.4–7.7)
Neutrophils Relative %: 51.8 % (ref 43.0–77.0)
Platelets: 292 10*3/uL (ref 150.0–400.0)
RBC: 4.2 Mil/uL (ref 3.87–5.11)
RDW: 12.7 % (ref 11.5–15.5)
WBC: 6.9 10*3/uL (ref 4.0–10.5)

## 2020-03-27 LAB — LIPID PANEL
Cholesterol: 164 mg/dL (ref 0–200)
HDL: 50.3 mg/dL (ref >39.00)
LDL Cholesterol: 91 mg/dL (ref 0–99)
NonHDL: 113.83
Total CHOL/HDL Ratio: 3
Triglycerides: 115 mg/dL (ref 0.0–149.0)
VLDL: 23 mg/dL (ref 0.0–40.0)

## 2020-03-27 LAB — TSH: TSH: 1.27 u[IU]/mL (ref 0.35–4.50)

## 2020-03-27 NOTE — Assessment & Plan Note (Signed)
Chronic Has lost > 20 lbs - would like to lose more Exercising regularly Has decreased portions and does calorie counting

## 2020-03-27 NOTE — Assessment & Plan Note (Signed)
Check a1c Low sugar / carb diet Stressed regular exercise   

## 2020-03-27 NOTE — Assessment & Plan Note (Signed)
Chronic has lost > 20 lbs cmp

## 2020-04-24 ENCOUNTER — Encounter: Payer: Self-pay | Admitting: Internal Medicine

## 2020-04-24 NOTE — Progress Notes (Signed)
Outside notes received. Information abstracted. Notes sent to scan.  

## 2020-10-13 LAB — HM DEXA SCAN

## 2021-04-05 IMAGING — US ULTRASOUND ABDOMEN COMPLETE
1 series · 14 of 25 positions shown · non-contrast
Comparison: None.

CLINICAL DATA: Abnormal liver enzymes.

EXAM:
ABDOMEN ULTRASOUND COMPLETE

[Series 1: ultrasound abdomen complete · 14 of 95 slices shown]
[im 1/95]
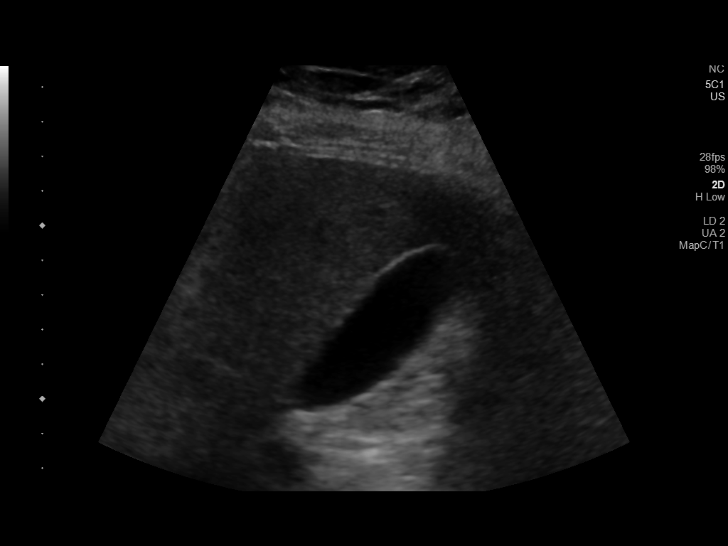
[im 8/95]
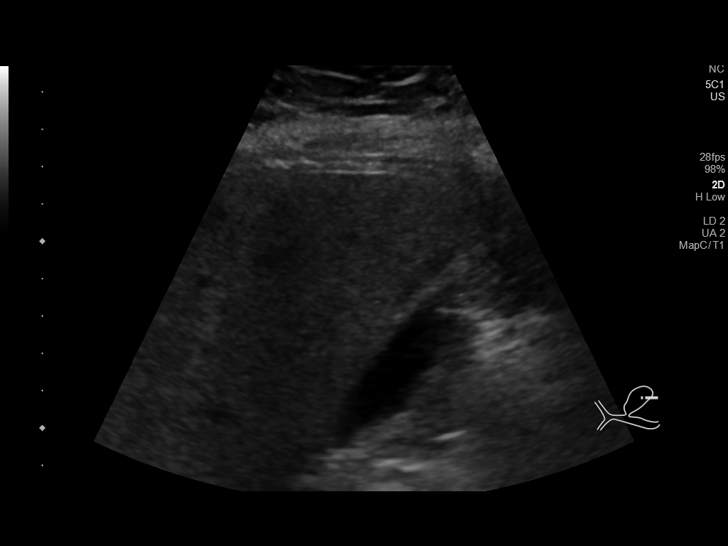
[im 16/95]
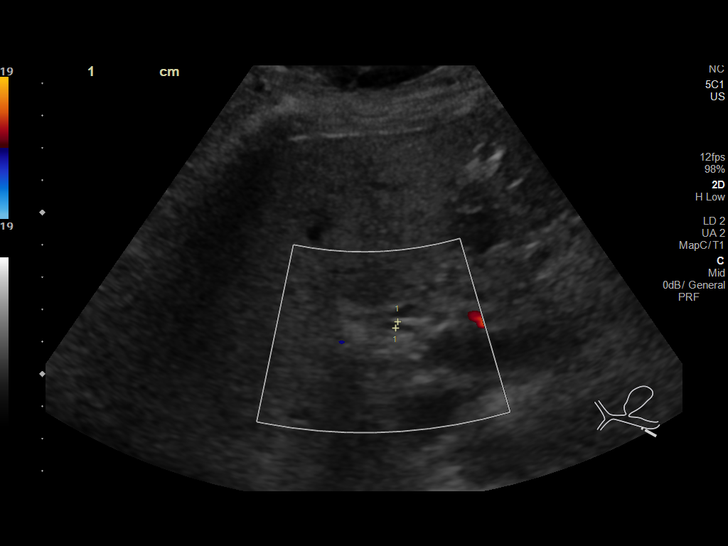
[im 24/95]
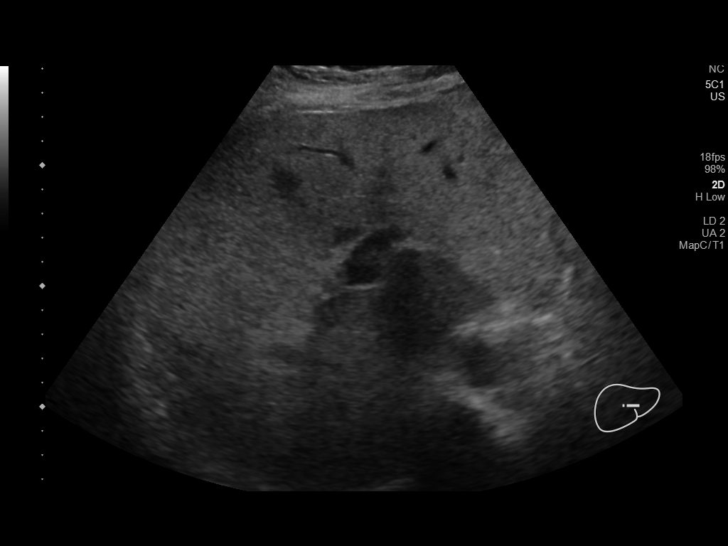
[im 32/95]
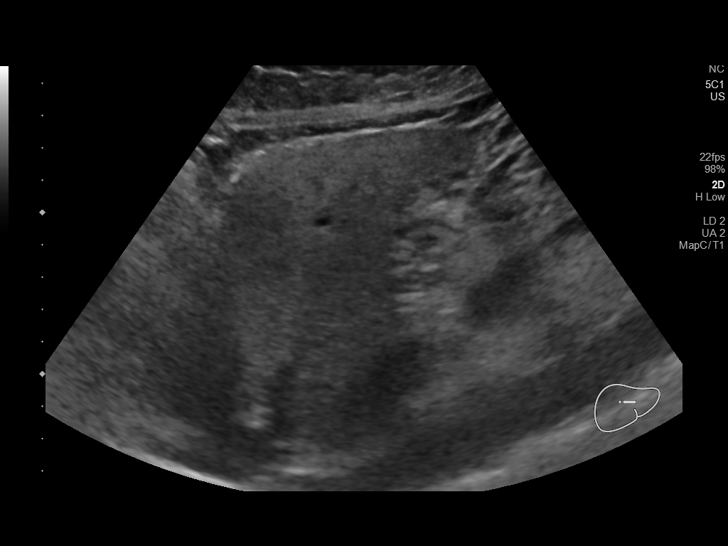
[im 36/95]
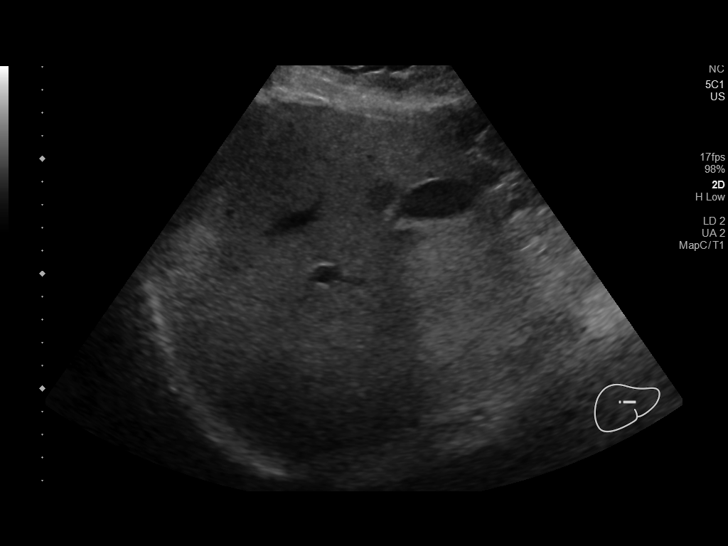
[im 44/95]
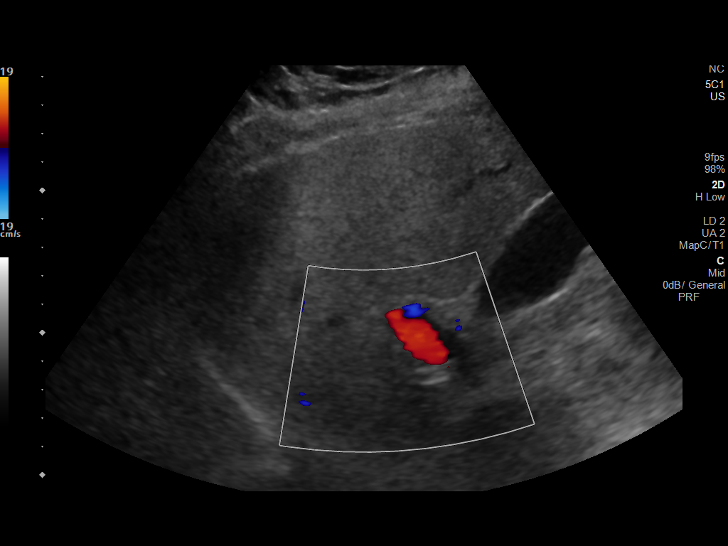
[im 51/95]
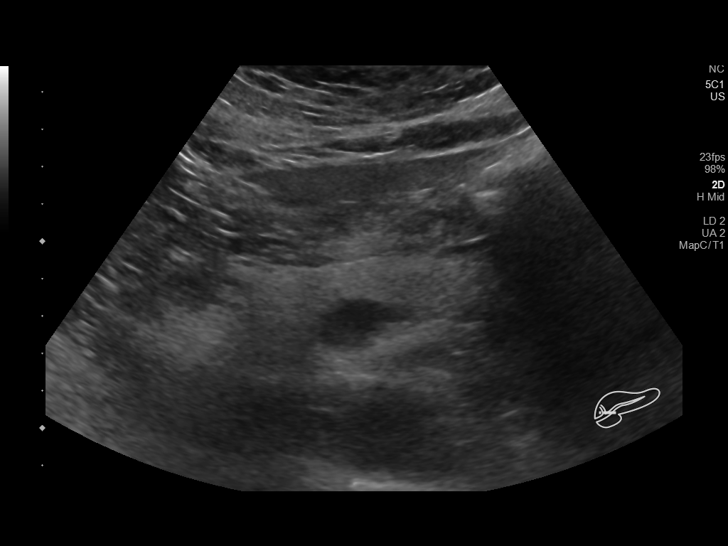
[im 59/95]
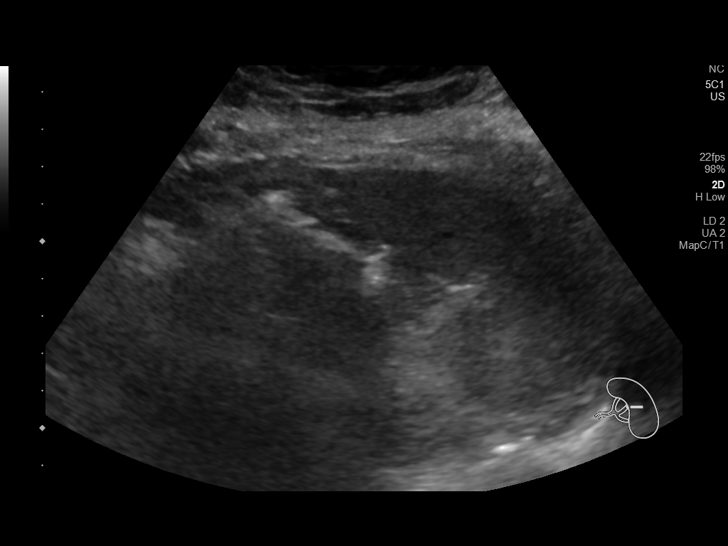
[im 63/95]
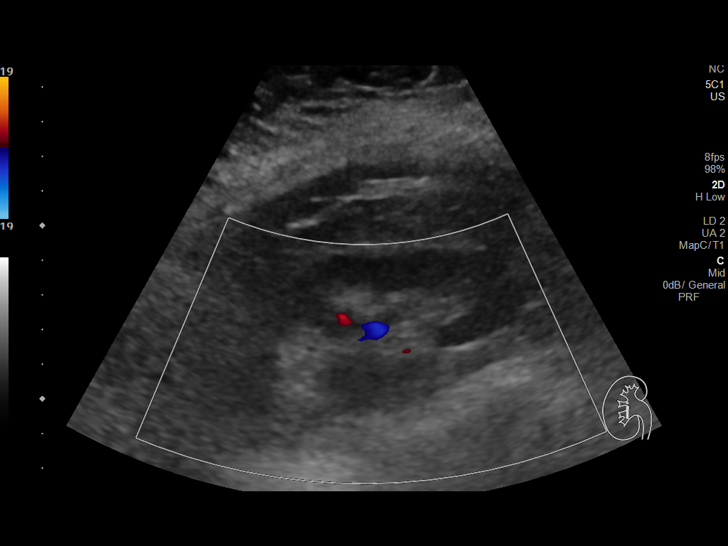
[im 71/95]
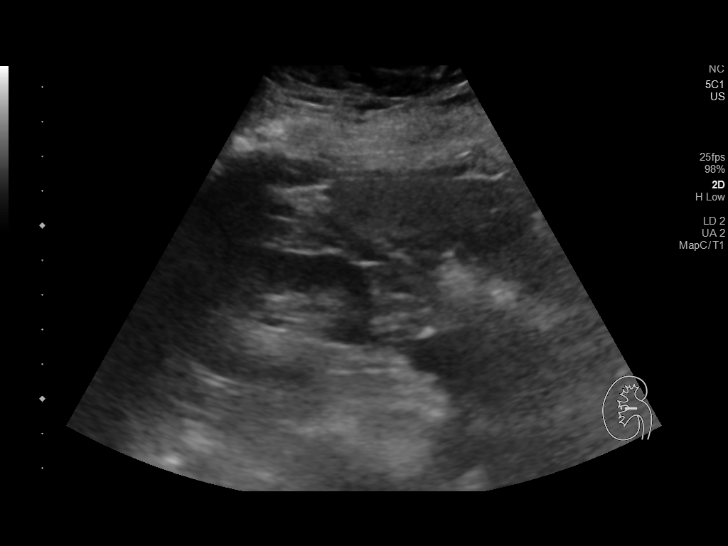
[im 79/95]
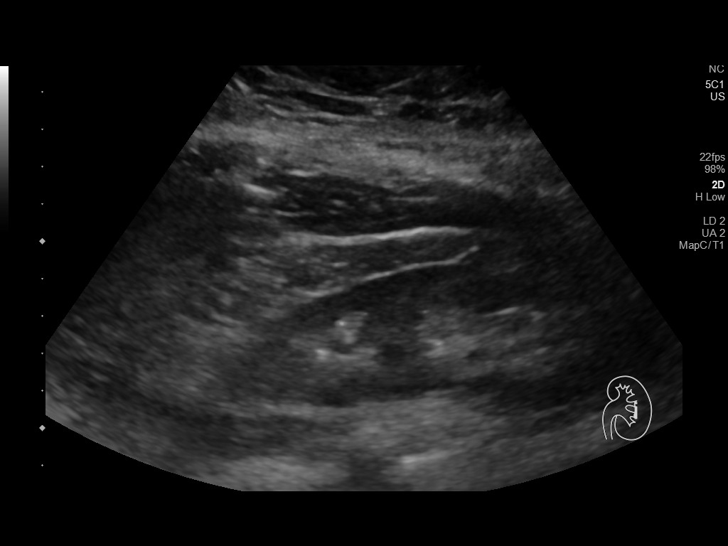
[im 87/95]
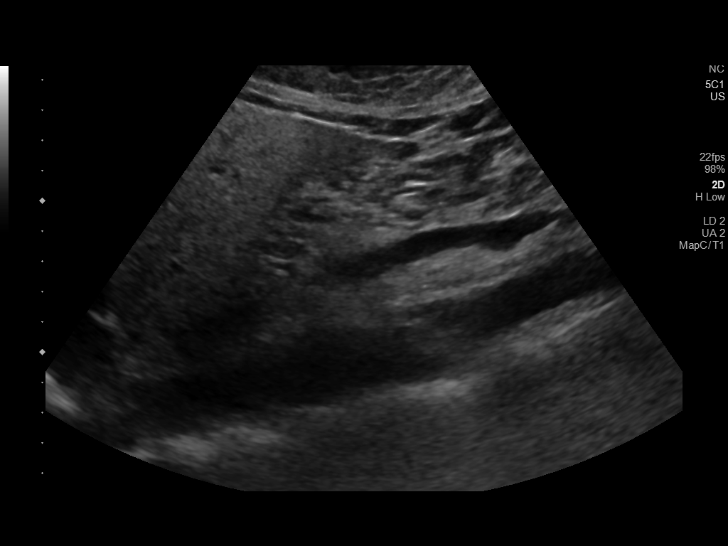
[im 95/95]
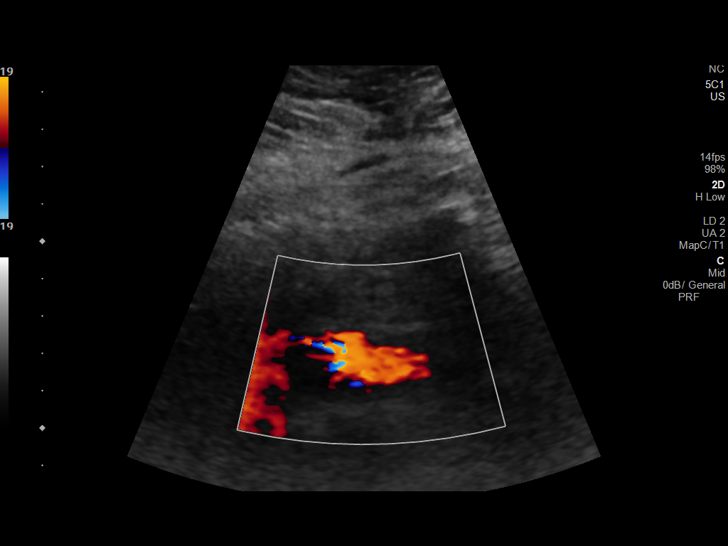

[14 of 25 positions shown; findings below may reference images not displayed]

FINDINGS: Gallbladder: No gallstones or wall thickening visualized. No
sonographic Murphy sign noted by sonographer.

Common bile duct: Diameter: 2.1 mm

Liver: Increased echogenicity consistent fatty infiltration or
hepatocellular disease. No focal hepatic abnormality identified.
Portal vein is patent on color Doppler imaging with normal direction
of blood flow towards the liver.

IVC: No abnormality visualized.

Pancreas: Visualized portion unremarkable.

Spleen: Size and appearance within normal limits.

Right Kidney: Length: 8.9 cm. Echogenicity within normal limits. No
mass or hydronephrosis visualized.

Left Kidney: Length: 10.9 cm. Echogenicity within normal limits. No
mass or hydronephrosis visualized.

Abdominal aorta: No aneurysm visualized.

Other findings: None.
IMPRESSION: 1.  No acute abnormality.  No gallstones or biliary distention.

2. Increased hepatic echogenicity consistent with fatty infiltration
and/or hepatocellular disease.

## 2021-05-11 IMAGING — US US ABDOMEN LIMITED W/ ELASTOGRAPHY
1 series · 13 of 25 positions shown · non-contrast
Comparison: None.

CLINICAL DATA: Elevated liver enzymes.

EXAM:
US ABDOMEN LIMITED - RIGHT UPPER QUADRANT
ULTRASOUND HEPATIC ELASTOGRAPHY
TECHNIQUE: Limited right upper quadrant abdominal ultrasound was performed. In
addition, ultrasound elastography evaluation of the liver was
performed. A region of interest was placed in the right lobe of the
liver. Following application of a compressive sonographic pulse,
shear waves were detected in the adjacent hepatic tissue and the
shear wave velocity was calculated. Multiple assessments were
performed at the selected site. Median shear wave velocity is
correlated to a Metavir fibrosis score.

[Series 1: us abdomen limited w/ elastography · 13 of 57 slices shown]
[im 1/57]
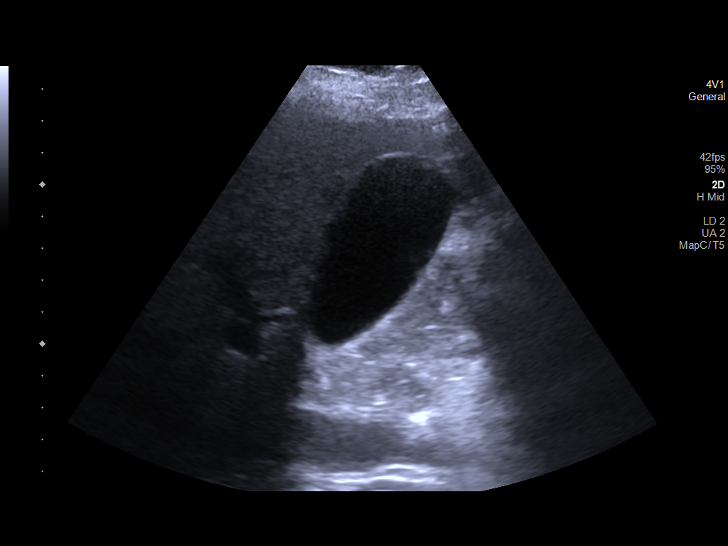
[im 5/57]
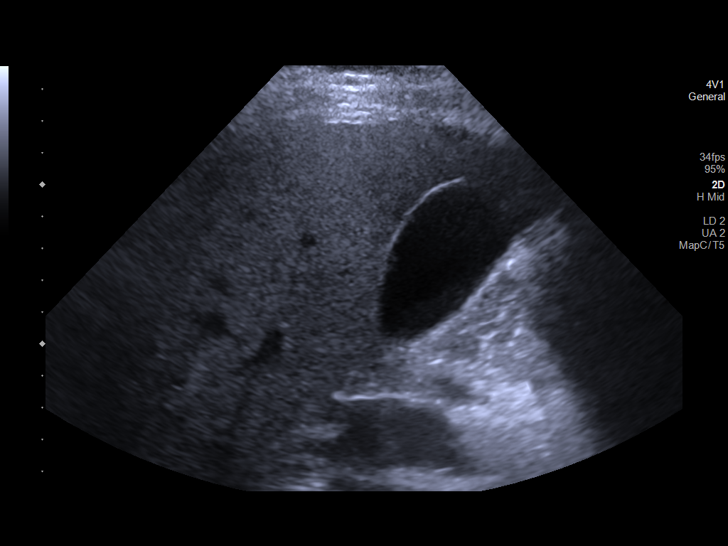
[im 10/57]
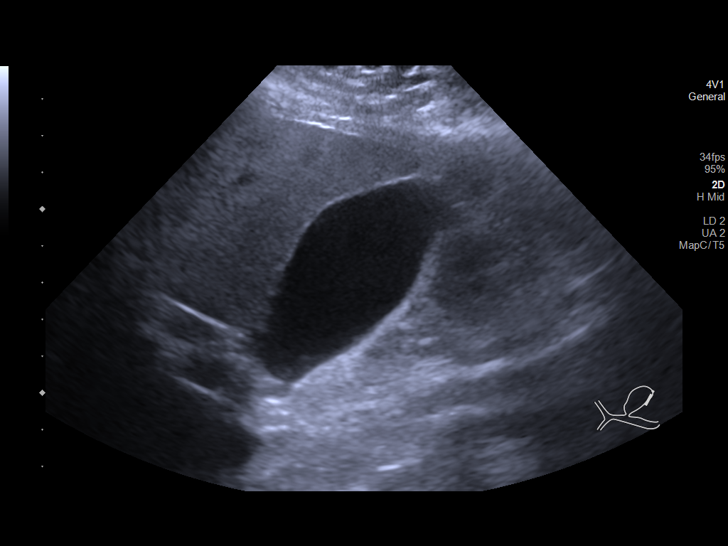
[im 15/57]
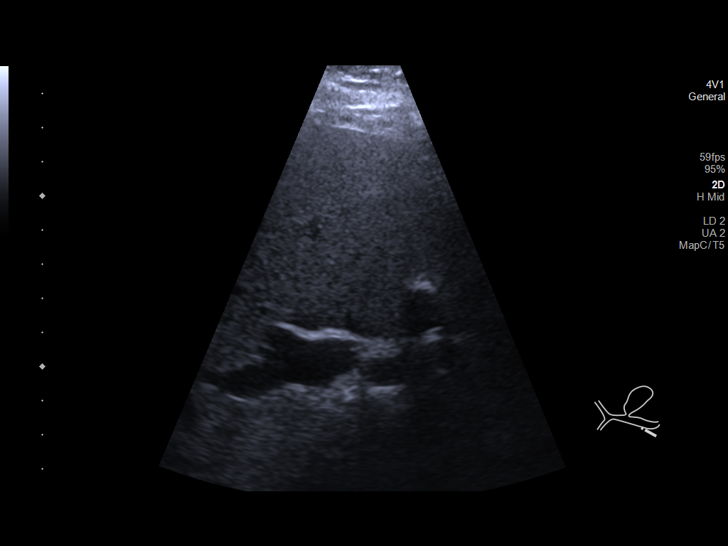
[im 19/57]
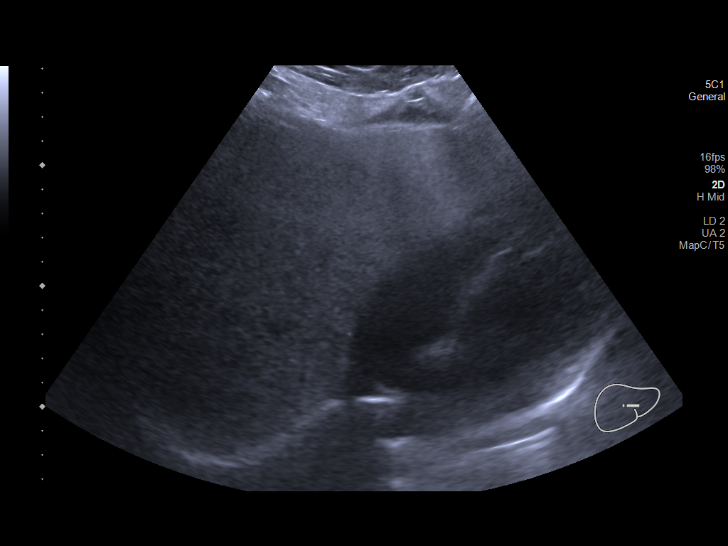
[im 24/57]
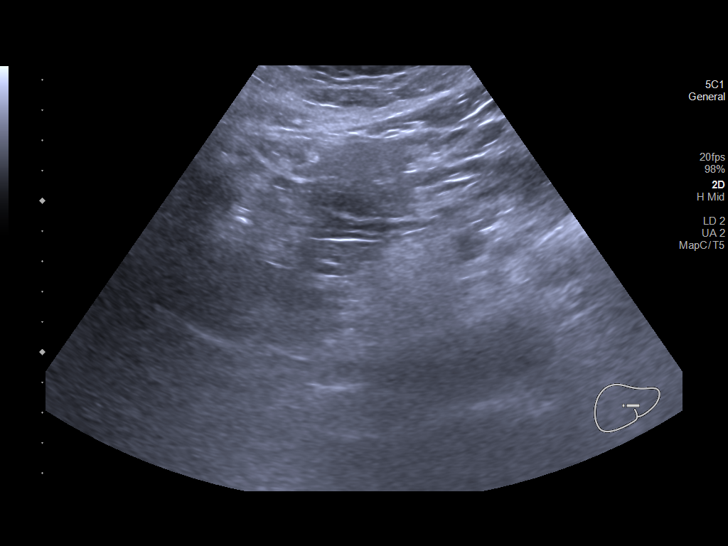
[im 29/57]
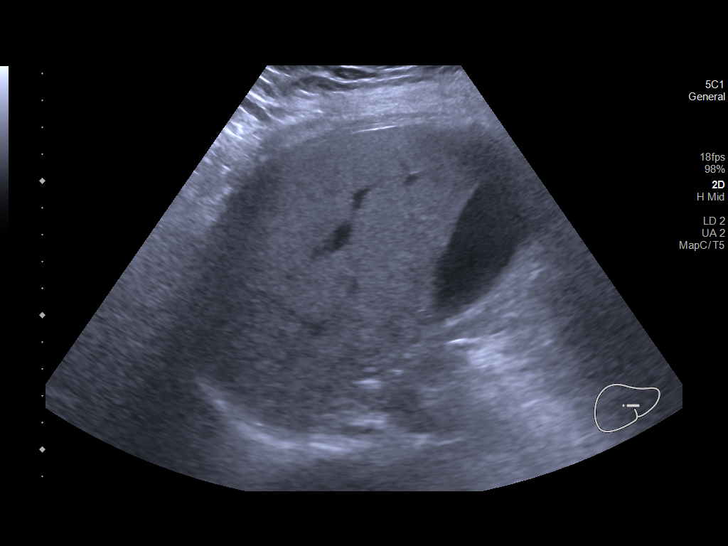
[im 33/57]
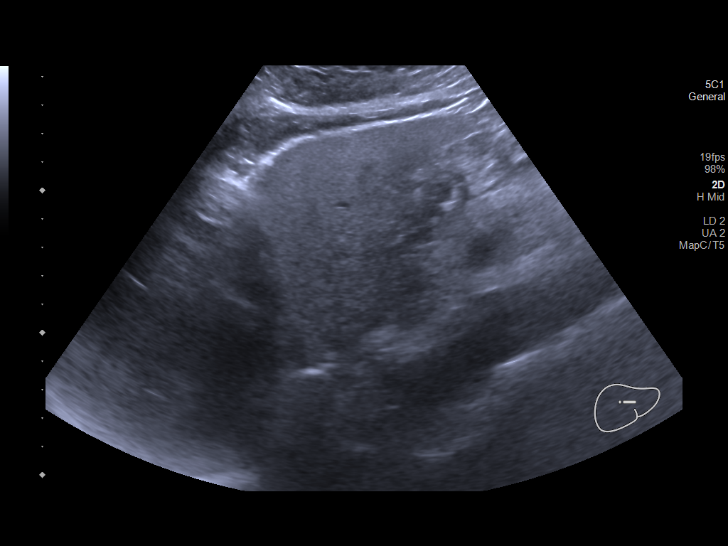
[im 38/57]
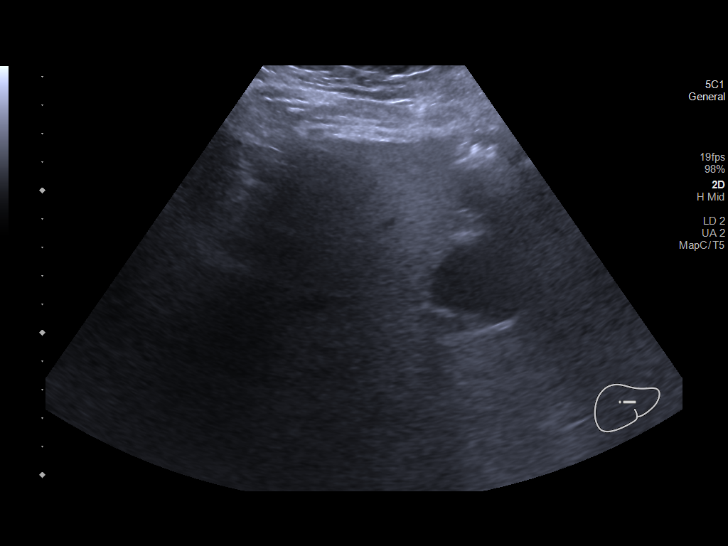
[im 43/57]
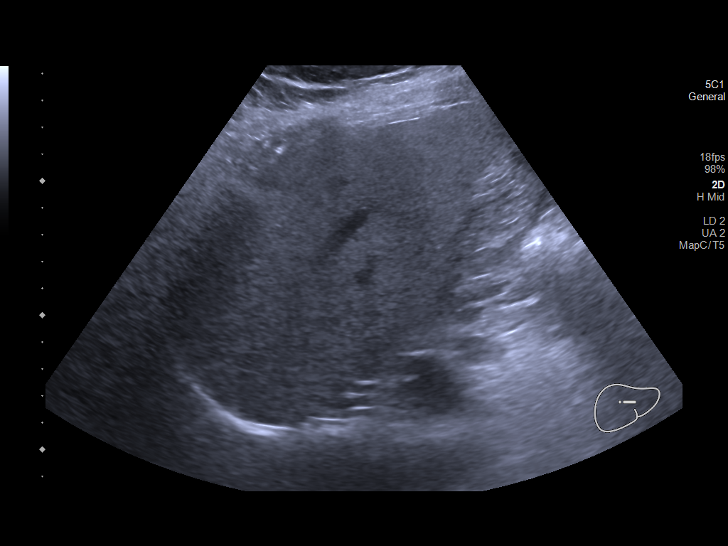
[im 47/57]
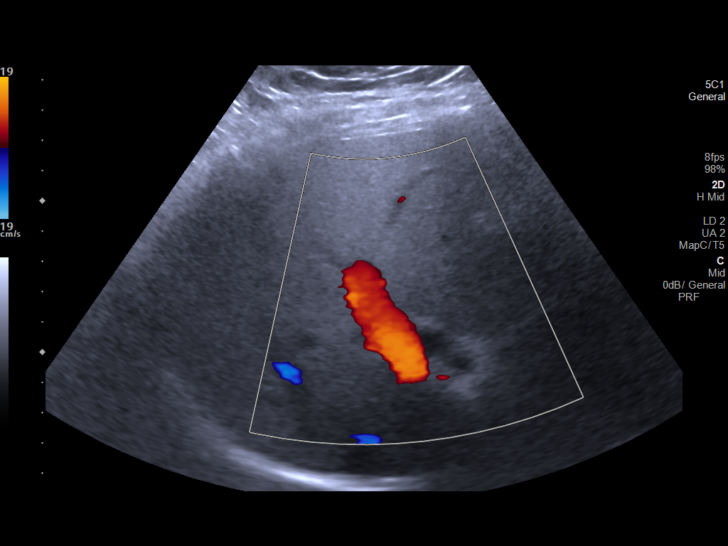
[im 52/57]
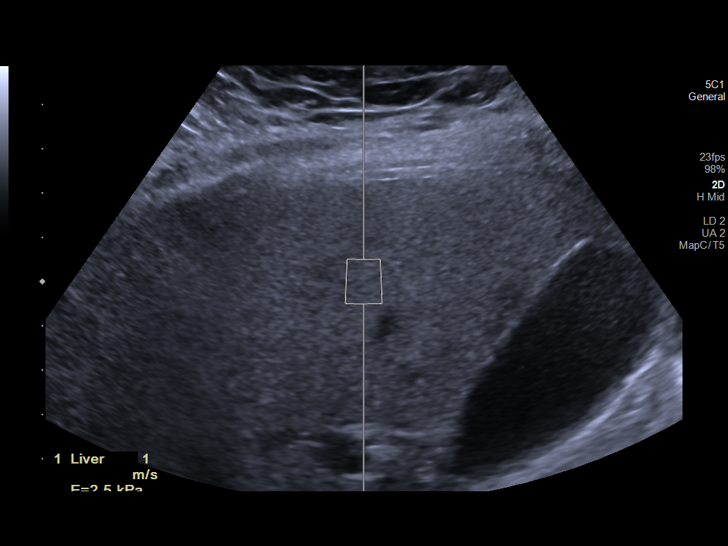
[im 57/57]
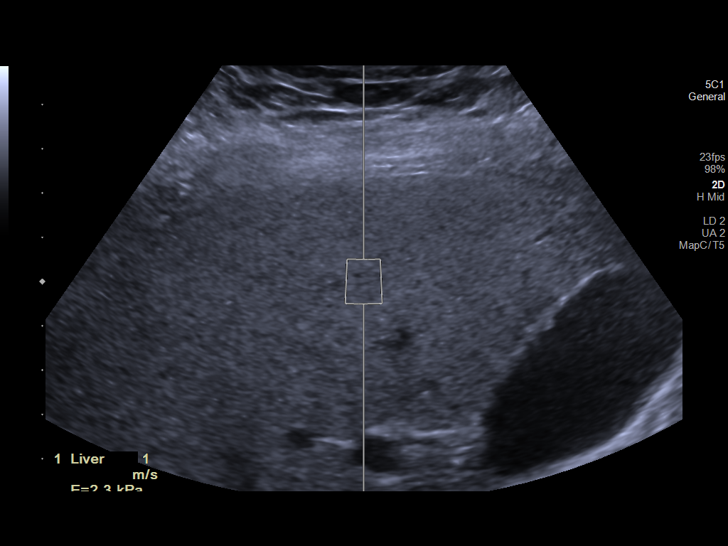

[13 of 25 positions shown; findings below may reference images not displayed]

FINDINGS: ULTRASOUND ABDOMEN LIMITED RIGHT UPPER QUADRANT

Gallbladder:

No gallstones or wall thickening visualized. No sonographic Murphy
sign noted.

Common bile duct:

Diameter: 1 mm

Liver:

Mildly increased echogenicity of the hepatic parenchyma, consistent
with hepatic steatosis. No hepatic mass identified. Portal vein is
patent on color Doppler imaging with normal direction of blood flow
towards the liver.

ULTRASOUND HEPATIC ELASTOGRAPHY

Device: Siemens Helix VTQ

Patient position: Supine

Transducer 5C1

Number of measurements: 10

Hepatic segment:  8

Median velocity:   0.97 m/sec

IQR:

IQR/Median velocity ratio:

Corresponding Metavir fibrosis score:  F0/F1

Risk of fibrosis: Minimal

Limitations of exam: None

Please note that abnormal shear wave velocities may also be
identified in clinical settings other than with hepatic fibrosis,
such as: acute hepatitis, elevated right heart and central venous
pressures including use of beta blockers, Kapoios disease
(Indrik), infiltrative processes such as
mastocytosis/amyloidosis/infiltrative tumor, extrahepatic
cholestasis, in the post-prandial state, and liver transplantation.
Correlation with patient history, laboratory data, and clinical
condition recommended.
IMPRESSION: ULTRASOUND ABDOMEN:

Mild diffuse hepatic steatosis. No other hepatobiliary abnormality
identified.

ULTRASOUND HEPATIC ELASTOGRAHY:

Median hepatic shear wave velocity is calculated at 0.97 m/sec.

Corresponding Metavir fibrosis score is F0/F1.

Risk of fibrosis is Minimal.

Follow-up: None required

## 2021-09-30 ENCOUNTER — Encounter: Payer: Self-pay | Admitting: Family Medicine

## 2021-09-30 ENCOUNTER — Telehealth (INDEPENDENT_AMBULATORY_CARE_PROVIDER_SITE_OTHER): Payer: No Typology Code available for payment source | Admitting: Family Medicine

## 2021-09-30 VITALS — Temp 100.0°F

## 2021-09-30 DIAGNOSIS — U071 COVID-19: Secondary | ICD-10-CM | POA: Diagnosis not present

## 2021-09-30 DIAGNOSIS — R0982 Postnasal drip: Secondary | ICD-10-CM | POA: Insufficient documentation

## 2021-09-30 DIAGNOSIS — R509 Fever, unspecified: Secondary | ICD-10-CM | POA: Diagnosis not present

## 2021-09-30 MED ORDER — MOLNUPIRAVIR 200 MG PO CAPS: 4.0000 | ORAL_CAPSULE | Freq: Two times a day (BID) | ORAL | 0 refills | Status: AC

## 2021-09-30 NOTE — Assessment & Plan Note (Signed)
She is not in any acute distress.  Molnupiravir prescribed (no labs since 04/06/2020).  Discussed how to take medication, potential side effects and symptomatic management. Discussed nutritious diet, hydration and rest. Counseling on CDC guidelines for quarantine and isolation. She will follow-up if worsening or if she has any new concerns or questions.

## 2021-09-30 NOTE — Progress Notes (Signed)
MyChart Video Visit    Virtual Visit via Video Note   This visit type was conducted due to national recommendations for restrictions regarding the COVID-19 Pandemic (e.g. social distancing) in an effort to limit this patient's exposure and mitigate transmission in our community. This patient is at least at moderate risk for complications without adequate follow up. This format is felt to be most appropriate for this patient at this time. Physical exam was limited by quality of the video and audio technology used for the visit. CMA was able to get the patient set up on a video visit.  Patient location: Home. Patient and provider in visit Provider location: Office  I discussed the limitations of evaluation and management by telemedicine and the availability of in person appointments. The patient expressed understanding and agreed to proceed.  Visit Date: 09/30/2021  Today's healthcare provider: Hetty Blend, NP-C     Subjective:    Patient ID: Joanna Velez, female    DOB: Nov 14, 1965, 56 y.o.   MRN: [id number]  Chief Complaint  Patient presents with   Covid Positive    Tested positive this morning Sx include sore throat and fever starting today    HPI  Symptom onset last night with low-grade fever, rhinorrhea, sore throat, and postnasal drainage.  Denies chills, body aches, fatigue, headache, dizziness, chest pain, palpitations, shortness of breath, cough, abdominal pain, nausea, vomiting or diarrhea.   States she has had several Covid vaccines.  First time having Covid.      Past Medical History:  Diagnosis Date   Blood in stool    Diverticulosis    History of chicken pox     Past Surgical History:  Procedure Laterality Date   COLONOSCOPY     WISDOM TOOTH EXTRACTION      Family History  Problem Relation Age of Onset   Macular degeneration Mother    Dementia Mother    Stroke Father    Gallbladder disease Father    Diabetes Father    Melanoma Father     Heart disease Maternal Grandfather    Lung cancer Paternal Grandfather    Alzheimer's disease Paternal Grandmother    Heart defect Daughter    Colon cancer Neg Hx    Colon polyps Neg Hx    Esophageal cancer Neg Hx    Stomach cancer Neg Hx    Rectal cancer Neg Hx     Social History   Socioeconomic History   Marital status: Married    Spouse name: Not on file   Number of children: 2   Years of education: Not on file   Highest education level: Not on file  Occupational History   Occupation: marketing  Tobacco Use   Smoking status: Former    Types: Cigarettes    Quit date: 02/15/1993    Years since quitting: 28.6   Smokeless tobacco: Never  Vaping Use   Vaping Use: Never used  Substance and Sexual Activity   Alcohol use: Yes    Alcohol/week: 2.0 standard drinks of alcohol    Types: 2 Glasses of wine per week    Comment: 1-2 per week   Drug use: No   Sexual activity: Not on file  Other Topics Concern   Not on file  Social History Narrative   Not on file   Social Determinants of Health   Financial Resource Strain: Not on file  Food Insecurity: Not on file  Transportation Needs: Not on file  Physical Activity: Not on  file  Stress: Not on file  Social Connections: Not on file  Intimate Partner Violence: Not on file    Outpatient Medications Prior to Visit  Medication Sig Dispense Refill   Calcium-Magnesium-Vitamin D (CALCIUM 1200+D3 PO) Take by mouth.     Cyanocobalamin (VITAMIN B12 PO) Take 1 tablet by mouth daily.     ECHINACEA PO Take 1 tablet by mouth daily.     Multiple Vitamins-Minerals (CENTRUM SILVER ULTRA WOMENS PO) Take 1 tablet by mouth daily.     No facility-administered medications prior to visit.    No Known Allergies  ROS     Objective:    Physical Exam  Temp 100 F (37.8 C)  Wt Readings from Last 3 Encounters:  03/27/20 176 lb (79.8 kg)  11/13/18 191 lb (86.6 kg)  10/30/18 190 lb (86.2 kg)   Alert and oriented and in no acute  distress.  Respirations unlabored.    Assessment & Plan:   Problem List Items Addressed This Visit       Other   COVID-19 virus infection - Primary    She is not in any acute distress.  Molnupiravir prescribed (no labs since 04/06/2020).  Discussed how to take medication, potential side effects and symptomatic management. Discussed nutritious diet, hydration and rest. Counseling on CDC guidelines for quarantine and isolation. She will follow-up if worsening or if she has any new concerns or questions.      Relevant Medications   molnupiravir EUA (LAGEVRIO) 200 MG CAPS capsule   Low grade fever    Recommend Tylenol or ibuprofen and staying well-hydrated.      Post-nasal drainage    I am having Joanna Velez start on molnupiravir EUA. I am also having her maintain her Multiple Vitamins-Minerals (CENTRUM SILVER ULTRA WOMENS PO), Cyanocobalamin (VITAMIN B12 PO), ECHINACEA PO, and Calcium-Magnesium-Vitamin D (CALCIUM 1200+D3 PO).  Meds ordered this encounter  Medications   molnupiravir EUA (LAGEVRIO) 200 MG CAPS capsule    Sig: Take 4 capsules (800 mg total) by mouth 2 (two) times daily for 5 days.    Dispense:  40 capsule    Refill:  0    Order Specific Question:   Supervising Provider    Answer:   Hillard Danker A [[id number]]     I discussed the assessment and treatment plan with the patient. The patient was provided an opportunity to ask questions and all were answered. The patient agreed with the plan and demonstrated an understanding of the instructions.   The patient was advised to call back or seek an in-person evaluation if the symptoms worsen or if the condition fails to improve as anticipated.  I provided 17 minutes of face-to-face time during this encounter.   Hetty Blend, NP-C [facility] [place] [phone] (phone) [phone] (fax)  [facility]

## 2021-09-30 NOTE — Assessment & Plan Note (Signed)
Recommend Tylenol or ibuprofen and staying well-hydrated.

## 2021-11-11 ENCOUNTER — Other Ambulatory Visit: Payer: Self-pay | Admitting: Obstetrics and Gynecology

## 2021-11-11 DIAGNOSIS — Z823 Family history of stroke: Secondary | ICD-10-CM

## 2021-12-04 ENCOUNTER — Ambulatory Visit
Admission: RE | Admit: 2021-12-04 | Discharge: 2021-12-04 | Disposition: A | Discharge status: Home / Self Care | Payer: No Typology Code available for payment source | Source: Ambulatory Visit | Attending: Obstetrics and Gynecology | Admitting: Obstetrics and Gynecology

## 2021-12-04 DIAGNOSIS — Z823 Family history of stroke: Secondary | ICD-10-CM

## 2021-12-24 ENCOUNTER — Telehealth: Payer: Self-pay

## 2021-12-24 NOTE — Telephone Encounter (Signed)
ERROR

## 2022-01-12 ENCOUNTER — Encounter: Payer: No Typology Code available for payment source | Admitting: Internal Medicine

## 2022-02-22 ENCOUNTER — Ambulatory Visit: Payer: No Typology Code available for payment source | Attending: Internal Medicine | Admitting: Internal Medicine

## 2022-02-22 ENCOUNTER — Encounter: Payer: Self-pay | Admitting: Internal Medicine

## 2022-02-22 VITALS — BP 132/70 | HR 64 | Ht 63.0 in | Wt 187.0 lb

## 2022-02-22 DIAGNOSIS — I2583 Coronary atherosclerosis due to lipid rich plaque: Secondary | ICD-10-CM

## 2022-02-22 DIAGNOSIS — Z833 Family history of diabetes mellitus: Secondary | ICD-10-CM

## 2022-02-22 DIAGNOSIS — Z1321 Encounter for screening for nutritional disorder: Secondary | ICD-10-CM

## 2022-02-22 DIAGNOSIS — I2510 Atherosclerotic heart disease of native coronary artery without angina pectoris: Secondary | ICD-10-CM

## 2022-02-22 NOTE — Progress Notes (Signed)
Cardiology Office Note   Date:  02/22/2022   ID:  Joanna Velez, DOB Apr 26, 1965, MRN [id number]  PCP:  Pincus Sanes, MD  Cardiologist:   Dietrich Pates, MD       History of Present Illness: Joanna Velez is a 57 y.o. female who recently had a Ca score CT   Ca score was 32.4 (84% percentile for age)  Mainly in RCA    Note that she had a CT scan in Sept 2020   Hepatic steotosis noted   She did have elevated liver enzymes in past that improved   The patient denies CP  No SOB  No palpitations    She exercises 4 to 5 x per week   Spins   Weights   Treadmill       Sleep  SInce hitting menopause sleep not as good   Diet  Br:  Protein bar.  Water.  Diet coke 7:30  Veggie stick Lunch  Greek yogurt no fat Yogurt 9(vanilla with cocunut, granoal) , grapes , pinapple Dinner Sandwich or slice of pizza  Occasional baked potato  Water   7:30   Occasional sweet after    Current Meds  Medication Sig   Calcium-Magnesium-Vitamin D (CALCIUM 1200+D3 PO) Take by mouth.   Cyanocobalamin (VITAMIN B12 PO) Take 1 tablet by mouth daily.   ECHINACEA PO Take 1 tablet by mouth daily.   hydrocortisone 2.5 % cream Apply 1 Application topically 2 (two) times daily.   Multiple Vitamins-Minerals (CENTRUM SILVER ULTRA WOMENS PO) Take 1 tablet by mouth daily.   NIACIN ER PO Take 500 mg by mouth daily at 6 (six) AM.   [DISCONTINUED] fluocinonide cream (LIDEX) 0.05 % Apply 1 Application topically 2 (two) times daily. FOR 1 TO 2 WEEKS.     Allergies:   Patient has no known allergies.   Past Medical History:  Diagnosis Date   Blood in stool    Diverticulosis    History of chicken pox     Past Surgical History:  Procedure Laterality Date   COLONOSCOPY     WISDOM TOOTH EXTRACTION       Social History:  The patient  reports that she quit smoking about 29 years ago. Her smoking use included cigarettes. She has never used smokeless tobacco. She reports current alcohol use of about 2.0 standard drinks of  alcohol per week. She reports that she does not use drugs.   Family History:  The patient's family history includes Alzheimer's disease in her paternal grandmother; Dementia in her mother; Diabetes in her father; Gallbladder disease in her father; Heart defect in her daughter; Heart disease in her maternal grandfather; Lung cancer in her paternal grandfather; Macular degeneration in her mother; Melanoma in her father; Stroke in her father.    ROS:  Please see the history of present illness. All other systems are reviewed and  Negative to the above problem except as noted.    PHYSICAL EXAM: VS:  BP 132/70   Pulse 64   Ht 5\' 3"  (1.6 m)   Wt 187 lb (84.8 kg)   BMI 33.13 kg/m   GEN: Well nourished, well developed, in no acute distress  HEENT: normal  Neck: no JVD, carotid bruits, Cardiac: RRR; no murmurs  No LE edema  Respiratory:  clear to auscultation bilaterally, GI: soft, nontender, nondistended, + BS  No hepatomegaly  MS: no deformity Moving all extremities   Skin: warm and dry, no rash Neuro:  Strength and sensation are  intact Psych: euthymic mood, full affect   EKG:  EKG is ordered today.  NSR 64 bpm    Lipid Panel    Component Value Date/Time   CHOL 164 03/27/2020 0922   CHOL 198 10/30/2018 1712   TRIG 115.0 03/27/2020 0922   TRIG 180 (H) 10/30/2018 1712   HDL 50.30 03/27/2020 0922   CHOLHDL 3 03/27/2020 0922   VLDL 23.0 03/27/2020 0922   LDLCALC 91 03/27/2020 0922      Wt Readings from Last 3 Encounters:  02/22/22 187 lb (84.8 kg)  03/27/20 176 lb (79.8 kg)  11/13/18 191 lb (86.6 kg)      ASSESSMENT AND PLAN:  1  CAD  Pt with CAD on calcium score CT   Score of 32.   I reviewed signficance of this with her   Overall her lipids are very good   But she has evid of metabolic issues with steatosis noted        Will get lipoomed, Lpa , ApoB     Should be on a statin      2  Metabolic   Pt with hepatic steatosis    Reviewed with pt what this means   Pt eats  minimal veggies   Needs to increase    Fatty changes in liver portend fatty visceral changes      Will get liver panel    Check Vit D and A1C  along with other labs  Follow up in 1 year    Current medicines are reviewed at length with the patient today.  The patient does not have concerns regarding medicines.  Signed, Dorris Carnes, MD  02/22/2022 2:00 PM    [address], [place], [place]  [address] Phone: [phone]; Fax: [phone]

## 2022-02-22 NOTE — Patient Instructions (Signed)
Medication Instructions:   *If you need a refill on your cardiac medications before your next appointment, please call your pharmacy*   Lab Work: CMET, VIT D, HGBA1C, NMR, APO B, LIPO A If you have labs (blood work) drawn today and your tests are completely normal, you will receive your results only by: [place] (if you have MyChart) OR A paper copy in the mail If you have any lab test that is abnormal or we need to change your treatment, we will call you to review the results.   Testing/Procedures:    Follow-Up: At [facility], you and your health needs are our priority.  As part of our continuing mission to provide you with exceptional heart care, we have created designated Provider Care Teams.  These Care Teams include your primary Cardiologist (physician) and Advanced Practice Providers (APPs -  Physician Assistants and Nurse Practitioners) who all work together to provide you with the care you need, when you need it.  We recommend signing up for the patient portal called "MyChart".  Sign up information is provided on this After Visit Summary.  MyChart is used to connect with patients for Virtual Visits (Telemedicine).  Patients are able to view lab/test results, encounter notes, upcoming appointments, etc.  Non-urgent messages can be sent to your provider as well.   To learn more about what you can do with MyChart, go to [email or web address].    Your next appointment:   1 year(s)  The format for your next appointment:   In Person  Provider:   DR Dorris Carnes     Other Instructions   Important Information About Sugar

## 2022-02-23 LAB — COMPREHENSIVE METABOLIC PANEL
ALT: 24 IU/L (ref 0–32)
AST: 24 IU/L (ref 0–40)
Albumin/Globulin Ratio: 1.8 (ref 1.2–2.2)
Albumin: 4.5 g/dL (ref 3.8–4.9)
Alkaline Phosphatase: 94 IU/L (ref 44–121)
BUN/Creatinine Ratio: 13 (ref 9–23)
BUN: 10 mg/dL (ref 6–24)
Bilirubin Total: 0.4 mg/dL (ref 0.0–1.2)
CO2: 22 mmol/L (ref 20–29)
Calcium: 9.3 mg/dL (ref 8.7–10.2)
Chloride: 103 mmol/L (ref 96–106)
Creatinine, Ser: 0.8 mg/dL (ref 0.57–1.00)
Globulin, Total: 2.5 g/dL (ref 1.5–4.5)
Glucose: 85 mg/dL (ref 70–99)
Potassium: 4.3 mmol/L (ref 3.5–5.2)
Sodium: 139 mmol/L (ref 134–144)
Total Protein: 7 g/dL (ref 6.0–8.5)
eGFR: 85 mL/min/{1.73_m2} (ref >59)

## 2022-02-23 LAB — NMR, LIPOPROFILE
Cholesterol, Total: 211 mg/dL — ABNORMAL HIGH (ref 100–199)
HDL Particle Number: 38.8 umol/L (ref >=30.5)
HDL-C: 59 mg/dL (ref >39)
LDL Particle Number: 1404 nmol/L — ABNORMAL HIGH (ref <1000)
LDL Size: 21.4 nm (ref >20.5)
LDL-C (NIH Calc): 127 mg/dL — ABNORMAL HIGH (ref 0–99)
LP-IR Score: 43 (ref <=45)
Small LDL Particle Number: 471 nmol/L (ref <=527)
Triglycerides: 142 mg/dL (ref 0–149)

## 2022-02-23 LAB — VITAMIN D 25 HYDROXY (VIT D DEFICIENCY, FRACTURES): Vit D, 25-Hydroxy: 36.2 ng/mL (ref 30.0–100.0)

## 2022-02-23 LAB — APOLIPOPROTEIN B: Apolipoprotein B: 103 mg/dL — ABNORMAL HIGH (ref <90)

## 2022-02-23 LAB — HEMOGLOBIN A1C
Est. average glucose Bld gHb Est-mCnc: 108 mg/dL
Hgb A1c MFr Bld: 5.4 % (ref 4.8–5.6)

## 2022-02-23 LAB — LIPOPROTEIN A (LPA): Lipoprotein (a): 53 nmol/L (ref <75.0)

## 2022-03-02 ENCOUNTER — Telehealth: Payer: Self-pay

## 2022-03-02 DIAGNOSIS — I2510 Atherosclerotic heart disease of native coronary artery without angina pectoris: Secondary | ICD-10-CM

## 2022-03-02 DIAGNOSIS — K760 Fatty (change of) liver, not elsewhere classified: Secondary | ICD-10-CM

## 2022-03-02 DIAGNOSIS — Z1321 Encounter for screening for nutritional disorder: Secondary | ICD-10-CM

## 2022-03-02 DIAGNOSIS — E785 Hyperlipidemia, unspecified: Secondary | ICD-10-CM

## 2022-03-02 DIAGNOSIS — E559 Vitamin D deficiency, unspecified: Secondary | ICD-10-CM

## 2022-03-02 DIAGNOSIS — Z79899 Other long term (current) drug therapy: Secondary | ICD-10-CM

## 2022-03-02 MED ORDER — ATORVASTATIN CALCIUM 20 MG PO TABS: 20.0000 mg | ORAL_TABLET | Freq: Every day | ORAL | 3 refills | Status: DC

## 2022-03-02 MED ORDER — VITAMIN D3 125 MCG (5000 UT) PO TABS: 5000.0000 [IU] | ORAL_TABLET | Freq: Every day | ORAL | 3 refills | Status: AC

## 2022-03-02 NOTE — Telephone Encounter (Signed)
Result and message sent to the pts My Chart for their review.    Will follow up with the pt for a lab date.

## 2022-03-02 NOTE — Telephone Encounter (Signed)
-----  Message from Fay Records, MD sent at 02/23/2022 11:05 PM EST ----- LIpids  LDL is 127    WIth CAD, goal is 70 or less   Recomm lipitor 20 mg   Follow up lipomed in 8 wk with liver panel Liver function is OK  (still has steatosis)   Kidney function normal  A1C is 5.4  keep working to limit carbs Vit D is a littl low  Level should be between 50 to 100  CHeck how much she is taking    I would reocmm 5000 per day  Follow up level with other labs

## 2022-03-09 NOTE — Telephone Encounter (Signed)
Pt has made the changes a reccommended by Dr Harrington Challenger and will have labs done 04/21/22.

## 2022-04-21 ENCOUNTER — Ambulatory Visit: Payer: No Typology Code available for payment source | Attending: Internal Medicine

## 2022-04-21 DIAGNOSIS — K760 Fatty (change of) liver, not elsewhere classified: Secondary | ICD-10-CM

## 2022-04-21 DIAGNOSIS — E559 Vitamin D deficiency, unspecified: Secondary | ICD-10-CM

## 2022-04-21 DIAGNOSIS — Z79899 Other long term (current) drug therapy: Secondary | ICD-10-CM

## 2022-04-21 DIAGNOSIS — E785 Hyperlipidemia, unspecified: Secondary | ICD-10-CM

## 2022-04-21 DIAGNOSIS — I2510 Atherosclerotic heart disease of native coronary artery without angina pectoris: Secondary | ICD-10-CM

## 2022-04-21 DIAGNOSIS — Z1321 Encounter for screening for nutritional disorder: Secondary | ICD-10-CM

## 2022-04-23 LAB — NMR, LIPOPROFILE
Cholesterol, Total: 140 mg/dL (ref 100–199)
HDL Particle Number: 40.8 umol/L (ref >=30.5)
HDL-C: 56 mg/dL (ref >39)
LDL Particle Number: 834 nmol/L (ref <1000)
LDL Size: 20.1 nm — ABNORMAL LOW (ref >20.5)
LDL-C (NIH Calc): 63 mg/dL (ref 0–99)
LP-IR Score: 57 — ABNORMAL HIGH (ref <=45)
Small LDL Particle Number: 547 nmol/L — ABNORMAL HIGH (ref <=527)
Triglycerides: 118 mg/dL (ref 0–149)

## 2022-04-23 LAB — HEPATIC FUNCTION PANEL
ALT: 22 IU/L (ref 0–32)
AST: 21 IU/L (ref 0–40)
Albumin: 4.4 g/dL (ref 3.8–4.9)
Alkaline Phosphatase: 100 IU/L (ref 44–121)
Bilirubin Total: 0.6 mg/dL (ref 0.0–1.2)
Bilirubin, Direct: 0.17 mg/dL (ref 0.00–0.40)
Total Protein: 7.2 g/dL (ref 6.0–8.5)

## 2022-04-23 LAB — VITAMIN D 25 HYDROXY (VIT D DEFICIENCY, FRACTURES): Vit D, 25-Hydroxy: 46.8 ng/mL (ref 30.0–100.0)

## 2022-11-01 LAB — HM MAMMOGRAPHY

## 2023-02-19 ENCOUNTER — Other Ambulatory Visit: Payer: Self-pay | Admitting: Internal Medicine

## 2023-03-03 ENCOUNTER — Encounter: Payer: No Typology Code available for payment source | Admitting: Family Medicine

## 2023-03-09 ENCOUNTER — Encounter: Payer: Self-pay | Admitting: Internal Medicine

## 2023-03-09 ENCOUNTER — Telehealth: Payer: Self-pay | Admitting: Internal Medicine

## 2023-03-09 DIAGNOSIS — M8580 Other specified disorders of bone density and structure, unspecified site: Secondary | ICD-10-CM | POA: Insufficient documentation

## 2023-03-09 DIAGNOSIS — I2510 Atherosclerotic heart disease of native coronary artery without angina pectoris: Secondary | ICD-10-CM | POA: Insufficient documentation

## 2023-03-09 DIAGNOSIS — R931 Abnormal findings on diagnostic imaging of heart and coronary circulation: Secondary | ICD-10-CM | POA: Insufficient documentation

## 2023-03-09 DIAGNOSIS — R87619 Unspecified abnormal cytological findings in specimens from cervix uteri: Secondary | ICD-10-CM | POA: Insufficient documentation

## 2023-03-09 NOTE — Progress Notes (Unsigned)
Subjective:    Patient ID: Joanna Velez, female    DOB: 1965/06/05, 58 y.o.   MRN: [id number]      HPI Joanna Velez is here for a Physical exam and her chronic medical problems.   Had CT CAC-score 32.  Did see Dr. Dietrich Pates.  Also has hepatic steatosis.  Lipids okay.  Additional lipid panel ordered.  Dr. Tenny Craw felt she should be on a statin.  Advised on diet changes.  Exercises regularly.  On testosterone gel - has not noticed a difference.  Was thinking about starting other hormones - not sure what to do.    Medications and allergies reviewed with patient and updated if appropriate.  Current Outpatient Medications on File Prior to Visit  Medication Sig Dispense Refill   atorvastatin (LIPITOR) 20 MG tablet TAKE 1 TABLET BY MOUTH EVERY DAY 90 tablet 0   Cholecalciferol (VITAMIN D3) 125 MCG (5000 UT) TABS Take 1 tablet (5,000 Units total) by mouth daily in the afternoon. 100 tablet 3   Cyanocobalamin (VITAMIN B12 PO) Take 1 tablet by mouth daily.     ECHINACEA PO Take 1 tablet by mouth daily.     Multiple Vitamins-Minerals (CENTRUM SILVER ULTRA WOMENS PO) Take 1 tablet by mouth daily.     Testosterone 10 MG/ACT (2%) GEL Place onto the skin.     UNABLE TO FIND daily. Med Name: Testosterone Propionate 2mg  cream/gel compound     No current facility-administered medications on file prior to visit.    Review of Systems  Constitutional:  Negative for fever.  Eyes:  Negative for visual disturbance.  Respiratory:  Positive for cough (residual from URI). Negative for shortness of breath and wheezing.   Cardiovascular:  Negative for chest pain, palpitations and leg swelling.  Gastrointestinal:  Negative for abdominal pain, blood in stool, constipation and diarrhea.       Occ gerd  Genitourinary:  Negative for dysuria.  Musculoskeletal:  Positive for arthralgias (hips hurt). Negative for back pain.  Skin:  Negative for rash.  Neurological:  Negative for light-headedness and headaches.   Psychiatric/Behavioral:  Negative for dysphoric mood. The patient is not nervous/anxious.        Objective:   Vitals:   03/10/23 0816  BP: 116/80  Pulse: 82  SpO2: 98%   Filed Weights   03/10/23 0816  Weight: 192 lb 6.4 oz (87.3 kg)   Body mass index is 34.08 kg/m.  BP Readings from Last 3 Encounters:  03/10/23 116/80  02/22/22 132/70  03/27/20 114/72    Wt Readings from Last 3 Encounters:  03/10/23 192 lb 6.4 oz (87.3 kg)  02/22/22 187 lb (84.8 kg)  03/27/20 176 lb (79.8 kg)       Physical Exam Constitutional: She appears well-developed and well-nourished. No distress.  HENT:  Head: Normocephalic and atraumatic.  Right Ear: External ear normal. Normal ear canal and TM Left Ear: External ear normal.  Normal ear canal and TM Mouth/Throat: Oropharynx is clear and moist.  Eyes: Conjunctivae normal.  Neck: Neck supple. No tracheal deviation present. No thyromegaly present.  No carotid bruit  Cardiovascular: Normal rate, regular rhythm and normal heart sounds.   No murmur heard.  No edema. Pulmonary/Chest: Effort normal and breath sounds normal. No respiratory distress. She has no wheezes. She has no rales.  Breast: deferred   Abdominal: Soft. She exhibits no distension. There is no tenderness.  Lymphadenopathy: She has no cervical adenopathy.  Skin: Skin is warm and dry. She is  not diaphoretic.  Psychiatric: She has a normal mood and affect. Her behavior is normal.     Lab Results  Component Value Date   WBC 6.9 03/27/2020   HGB 14.6 03/27/2020   HCT 41.6 03/27/2020   PLT 292.0 03/27/2020   GLUCOSE 85 02/22/2022   CHOL 164 03/27/2020   TRIG 115.0 03/27/2020   HDL 50.30 03/27/2020   LDLCALC 91 03/27/2020   ALT 22 04/21/2022   AST 21 04/21/2022   NA 139 02/22/2022   K 4.3 02/22/2022   CL 103 02/22/2022   CREATININE 0.80 02/22/2022   BUN 10 02/22/2022   CO2 22 02/22/2022   TSH 1.27 03/27/2020   HGBA1C 5.4 02/22/2022         Assessment & Plan:    Physical exam: Screening blood work  ordered Exercise  regular - spins, treadmill, weights - 3-4 times /week Weight obese-encouraged weight loss Substance abuse  none   Reviewed recommended immunizations.   Health Maintenance  Topic Date Due   Cervical Cancer Screening (HPV/Pap Cotest)  07/14/2020   MAMMOGRAM  09/25/2021   Zoster Vaccines- Shingrix (1 of 2) 06/08/2023 (Originally 03/08/1982)   DTaP/Tdap/Td (1 - Tdap) 03/09/2024 (Originally 03/08/1982)   Pneumococcal Vaccine 59-73 Years old (1 of 2 - PCV) 03/09/2024 (Originally 03/08/1969)   COVID-19 Vaccine (3 - Moderna risk series) 02/13/2025 (Originally 01/11/2020)   HIV Screening  11/13/2038 (Originally 03/08/1978)   Colonoscopy  09/15/2028   INFLUENZA VACCINE  Completed   Hepatitis C Screening  Completed   HPV VACCINES  Aged Out      Will get pap, dexa, mammo    See Problem List for Assessment and Plan of chronic medical problems.

## 2023-03-09 NOTE — Telephone Encounter (Signed)
Pt is requesting labs before her physical on 1.23.25. Please advise,  Thanks

## 2023-03-09 NOTE — Patient Instructions (Addendum)
Blood work was ordered.       Medications changes include :   None    A referral was ordered and someone will call you to schedule an appointment.     Return in about 1 year (around 03/09/2024) for Physical Exam.   Health Maintenance, Female Adopting a healthy lifestyle and getting preventive care are important in promoting health and wellness. Ask your health care provider about: The right schedule for you to have regular tests and exams. Things you can do on your own to prevent diseases and keep yourself healthy. What should I know about diet, weight, and exercise? Eat a healthy diet  Eat a diet that includes plenty of vegetables, fruits, low-fat dairy products, and lean protein. Do not eat a lot of foods that are high in solid fats, added sugars, or sodium. Maintain a healthy weight Body mass index (BMI) is used to identify weight problems. It estimates body fat based on height and weight. Your health care provider can help determine your BMI and help you achieve or maintain a healthy weight. Get regular exercise Get regular exercise. This is one of the most important things you can do for your health. Most adults should: Exercise for at least 150 minutes each week. The exercise should increase your heart rate and make you sweat (moderate-intensity exercise). Do strengthening exercises at least twice a week. This is in addition to the moderate-intensity exercise. Spend less time sitting. Even light physical activity can be beneficial. Watch cholesterol and blood lipids Have your blood tested for lipids and cholesterol at 60 years of age, then have this test every 5 years. Have your cholesterol levels checked more often if: Your lipid or cholesterol levels are high. You are older than 60 years of age. You are at high risk for heart disease. What should I know about cancer screening? Depending on your health history and family history, you may need to have cancer  screening at various ages. This may include screening for: Breast cancer. Cervical cancer. Colorectal cancer. Skin cancer. Lung cancer. What should I know about heart disease, diabetes, and high blood pressure? Blood pressure and heart disease High blood pressure causes heart disease and increases the risk of stroke. This is more likely to develop in people who have high blood pressure readings or are overweight. Have your blood pressure checked: Every 3-5 years if you are 91-44 years of age. Every year if you are 69 years old or older. Diabetes Have regular diabetes screenings. This checks your fasting blood sugar level. Have the screening done: Once every three years after age 83 if you are at a normal weight and have a low risk for diabetes. More often and at a younger age if you are overweight or have a high risk for diabetes. What should I know about preventing infection? Hepatitis B If you have a higher risk for hepatitis B, you should be screened for this virus. Talk with your health care provider to find out if you are at risk for hepatitis B infection. Hepatitis C Testing is recommended for: Everyone born from [birth year] through Apr 03, 1965. Anyone with known risk factors for hepatitis C. Sexually transmitted infections (STIs) Get screened for STIs, including gonorrhea and chlamydia, if: You are sexually active and are younger than 60 years of age. You are older than 60 years of age and your health care provider tells you that you are at risk for this type of infection. Your sexual activity has  changed since you were last screened, and you are at increased risk for chlamydia or gonorrhea. Ask your health care provider if you are at risk. Ask your health care provider about whether you are at high risk for HIV. Your health care provider may recommend a prescription medicine to help prevent HIV infection. If you choose to take medicine to prevent HIV, you should first get tested for HIV. You  should then be tested every 3 months for as long as you are taking the medicine. Pregnancy If you are about to stop having your period (premenopausal) and you may become pregnant, seek counseling before you get pregnant. Take 400 to 800 micrograms (mcg) of folic acid every day if you become pregnant. Ask for birth control (contraception) if you want to prevent pregnancy. Osteoporosis and menopause Osteoporosis is a disease in which the bones lose minerals and strength with aging. This can result in bone fractures. If you are 79 years old or older, or if you are at risk for osteoporosis and fractures, ask your health care provider if you should: Be screened for bone loss. Take a calcium or vitamin D supplement to lower your risk of fractures. Be given hormone replacement therapy (HRT) to treat symptoms of menopause. Follow these instructions at home: Alcohol use Do not drink alcohol if: Your health care provider tells you not to drink. You are pregnant, may be pregnant, or are planning to become pregnant. If you drink alcohol: Limit how much you have to: 0-1 drink a day. Know how much alcohol is in your drink. In the [place] one drink equals one 12 oz bottle of beer (355 mL), one 5 oz glass of wine (148 mL), or one 1 oz glass of hard liquor (44 mL). Lifestyle Do not use any products that contain nicotine or tobacco. These products include cigarettes, chewing tobacco, and vaping devices, such as e-cigarettes. If you need help quitting, ask your health care provider. Do not use street drugs. Do not share needles. Ask your health care provider for help if you need support or information about quitting drugs. General instructions Schedule regular health, dental, and eye exams. Stay current with your vaccines. Tell your health care provider if: You often feel depressed. You have ever been abused or do not feel safe at home. Summary Adopting a healthy lifestyle and getting preventive care are  important in promoting health and wellness. Follow your health care provider's instructions about healthy diet, exercising, and getting tested or screened for diseases. Follow your health care provider's instructions on monitoring your cholesterol and blood pressure. This information is not intended to replace advice given to you by your health care provider. Make sure you discuss any questions you have with your health care provider. Document Revised: 06/23/2020 Document Reviewed: 06/23/2020 Elsevier Patient Education  2024 [employer].

## 2023-03-10 ENCOUNTER — Encounter: Payer: Self-pay | Admitting: Internal Medicine

## 2023-03-10 ENCOUNTER — Ambulatory Visit (INDEPENDENT_AMBULATORY_CARE_PROVIDER_SITE_OTHER): Payer: No Typology Code available for payment source | Admitting: Internal Medicine

## 2023-03-10 VITALS — BP 116/80 | HR 82 | Ht 63.0 in | Wt 192.4 lb

## 2023-03-10 DIAGNOSIS — R931 Abnormal findings on diagnostic imaging of heart and coronary circulation: Secondary | ICD-10-CM

## 2023-03-10 DIAGNOSIS — I2510 Atherosclerotic heart disease of native coronary artery without angina pectoris: Secondary | ICD-10-CM | POA: Diagnosis not present

## 2023-03-10 DIAGNOSIS — N951 Menopausal and female climacteric states: Secondary | ICD-10-CM

## 2023-03-10 DIAGNOSIS — Z0000 Encounter for general adult medical examination without abnormal findings: Secondary | ICD-10-CM

## 2023-03-10 DIAGNOSIS — M8580 Other specified disorders of bone density and structure, unspecified site: Secondary | ICD-10-CM | POA: Diagnosis not present

## 2023-03-10 DIAGNOSIS — E66811 Obesity, class 1: Secondary | ICD-10-CM

## 2023-03-10 DIAGNOSIS — Z79890 Hormone replacement therapy (postmenopausal): Secondary | ICD-10-CM | POA: Insufficient documentation

## 2023-03-10 LAB — COMPREHENSIVE METABOLIC PANEL
ALT: 18 U/L (ref 0–35)
AST: 16 U/L (ref 0–37)
Albumin: 4.1 g/dL (ref 3.5–5.2)
Alkaline Phosphatase: 75 U/L (ref 39–117)
BUN: 12 mg/dL (ref 6–23)
CO2: 29 meq/L (ref 19–32)
Calcium: 8.7 mg/dL (ref 8.4–10.5)
Chloride: 104 meq/L (ref 96–112)
Creatinine, Ser: 0.74 mg/dL (ref 0.40–1.20)
GFR: 88.2 mL/min (ref >60.00)
Glucose, Bld: 103 mg/dL — ABNORMAL HIGH (ref 70–99)
Potassium: 4.4 meq/L (ref 3.5–5.1)
Sodium: 140 meq/L (ref 135–145)
Total Bilirubin: 0.5 mg/dL (ref 0.2–1.2)
Total Protein: 6.7 g/dL (ref 6.0–8.3)

## 2023-03-10 LAB — CBC WITH DIFFERENTIAL/PLATELET
Basophils Absolute: 0 10*3/uL (ref 0.0–0.1)
Basophils Relative: 0.5 % (ref 0.0–3.0)
Eosinophils Absolute: 0.2 10*3/uL (ref 0.0–0.7)
Eosinophils Relative: 2.4 % (ref 0.0–5.0)
HCT: 41 % (ref 36.0–46.0)
Hemoglobin: 14.3 g/dL (ref 12.0–15.0)
Lymphocytes Relative: 32.5 % (ref 12.0–46.0)
Lymphs Abs: 2.5 10*3/uL (ref 0.7–4.0)
MCHC: 34.8 g/dL (ref 30.0–36.0)
MCV: 99.4 fL (ref 78.0–100.0)
Monocytes Absolute: 0.6 10*3/uL (ref 0.1–1.0)
Monocytes Relative: 7.2 % (ref 3.0–12.0)
Neutro Abs: 4.5 10*3/uL (ref 1.4–7.7)
Neutrophils Relative %: 57.4 % (ref 43.0–77.0)
Platelets: 314 10*3/uL (ref 150.0–400.0)
RBC: 4.13 Mil/uL (ref 3.87–5.11)
RDW: 12.2 % (ref 11.5–15.5)
WBC: 7.8 10*3/uL (ref 4.0–10.5)

## 2023-03-10 LAB — LIPID PANEL
Cholesterol: 131 mg/dL (ref 0–200)
HDL: 53.5 mg/dL (ref >39.00)
LDL Cholesterol: 50 mg/dL (ref 0–99)
NonHDL: 77.12
Total CHOL/HDL Ratio: 2
Triglycerides: 137 mg/dL (ref 0.0–149.0)
VLDL: 27.4 mg/dL (ref 0.0–40.0)

## 2023-03-10 LAB — TSH: TSH: 1.2 u[IU]/mL (ref 0.35–5.50)

## 2023-03-10 NOTE — Assessment & Plan Note (Addendum)
Chronic Monitored by Dr Arelia Sneddon Will get report

## 2023-03-10 NOTE — Assessment & Plan Note (Signed)
Has several menopause symptoms On testosterone  - has not noticed any improvement in symptoms Will check testosterone level Advised to discuss with gyn re estrogen/progesterone HT

## 2023-03-10 NOTE — Assessment & Plan Note (Addendum)
Chronic Mild CAD-CAC score 32 Following with Dr Tenny Craw Exercising regularly Healthy diet encouraged On atorvastatin 20 mg daily

## 2023-03-10 NOTE — Assessment & Plan Note (Signed)
Chronic Difficulty losing weight - has lost weight and then regained it Exercising regularly - continue Needs to decrease calorie intake - ideally in a healthy way

## 2023-03-10 NOTE — Telephone Encounter (Signed)
Patient already in office

## 2023-03-12 ENCOUNTER — Encounter: Payer: Self-pay | Admitting: Internal Medicine

## 2023-03-14 ENCOUNTER — Encounter: Payer: Self-pay | Admitting: Obstetrics and Gynecology

## 2023-03-17 LAB — TESTOSTERONE, FREE & TOTAL
Free Testosterone: 3.7 pg/mL (ref 0.1–6.4)
Testosterone, Total, LC-MS-MS: 23 ng/dL (ref 2–45)

## 2023-04-19 NOTE — Progress Notes (Signed)
 Cardiology Office Note   Date:  04/22/2023   ID:  Joanna Velez, DOB 1965-08-15, MRN [id number]  PCP:  Pincus Sanes, MD  Cardiologist:   Dietrich Pates, MD   PT presents for follow up of CAD      History of Present Illness: Joanna Velez is a 58 y.o. female with hx of  Ca score CT in 2023   Ca score was 32.4 (84% percentile for age)  Mainly in RCA     Abdominal CT in 2020 showed hepatic steatosis.     I saw the pt in Jan 2024   Since seen she says she is feeling good   Active   No CP  No SOB   NO dizziness      Current Meds  Medication Sig   atorvastatin (LIPITOR) 20 MG tablet TAKE 1 TABLET BY MOUTH EVERY DAY   Cholecalciferol (VITAMIN D3) 125 MCG (5000 UT) TABS Take 1 tablet (5,000 Units total) by mouth daily in the afternoon.   Cyanocobalamin (VITAMIN B12 PO) Take 1 tablet by mouth daily.   ECHINACEA PO Take 1 tablet by mouth daily.   Multiple Vitamins-Minerals (CENTRUM SILVER ULTRA WOMENS PO) Take 1 tablet by mouth daily.   Testosterone 10 MG/ACT (2%) GEL Place onto the skin.   UNABLE TO FIND daily. Med Name: Testosterone Propionate 2mg  cream/gel compound     Allergies:   Patient has no known allergies.   Past Medical History:  Diagnosis Date   Blood in stool    Diverticulosis    History of chicken pox     Past Surgical History:  Procedure Laterality Date   COLONOSCOPY     WISDOM TOOTH EXTRACTION       Social History:  The patient  reports that she quit smoking about 30 years ago. Her smoking use included cigarettes. She has never used smokeless tobacco. She reports current alcohol use of about 1.0 standard drink of alcohol per week. She reports that she does not use drugs.   Family History:  The patient's family history includes Alzheimer's disease in her paternal grandmother; Cancer in her father; Dementia in her mother; Diabetes in her father; Gallbladder disease in her father; Heart defect in her daughter; Heart disease in her maternal grandfather; Lung cancer in  her paternal grandfather; Macular degeneration in her mother; Melanoma in her father; Stroke in her father; Vision loss in her mother.    ROS:  Please see the history of present illness. All other systems are reviewed and  Negative to the above problem except as noted.    PHYSICAL EXAM: VS:  BP 114/78   Pulse 69   Ht 5\' 3"  (1.6 m)   Wt 192 lb (87.1 kg)   SpO2 98%   BMI 34.01 kg/m   GEN: Obese 58 yo in no acute distress  HEENT: normal  Neck: no JVD, carotid bruits, Cardiac: RRR; no murmur  No LE edema  Respiratory:  clear to auscultation GI: soft, nontender, no masses  No hepatomegaly    EKG:  EKG is ordered today.  NSR 69 bpm    Lipid Panel    Component Value Date/Time   CHOL 131 03/10/2023 0907   CHOL 198 10/30/2018 1712   TRIG 137.0 03/10/2023 0907   TRIG 180 (H) 10/30/2018 1712   HDL 53.50 03/10/2023 0907   CHOLHDL 2 03/10/2023 0907   VLDL 27.4 03/10/2023 0907   LDLCALC 50 03/10/2023 0907      Wt Readings from Last  3 Encounters:  04/22/23 192 lb (87.1 kg)  03/10/23 192 lb 6.4 oz (87.3 kg)  02/22/22 187 lb (84.8 kg)      ASSESSMENT AND PLAN:  1  CAD  Pt with CAD on CT scan   No symptoms of angina   Continue risk factor modification.  2  HTN   Lipids excellent   I recomm she cut back on lipitro to 10 mg   Check lipomed in August 2025  3  Metabolic  Pt with hepatic steatosis on CT scan.  Reviewed diet   Given resources for education ([employer])    Will check A1C and liver panel in [place]        Current medicines are reviewed at length with the patient today.  The patient does not have concerns regarding medicines.  Signed, Dietrich Pates, MD  04/22/2023 4:10 PM    [facility] [address] [place], [place], [place]  [address] Phone: [phone]; Fax: [phone]

## 2023-04-22 ENCOUNTER — Other Ambulatory Visit: Payer: Self-pay | Admitting: *Deleted

## 2023-04-22 ENCOUNTER — Encounter: Payer: Self-pay | Admitting: Internal Medicine

## 2023-04-22 ENCOUNTER — Ambulatory Visit: Payer: PRIVATE HEALTH INSURANCE | Attending: Internal Medicine | Admitting: Internal Medicine

## 2023-04-22 VITALS — BP 114/78 | HR 69 | Ht 63.0 in | Wt 192.0 lb

## 2023-04-22 DIAGNOSIS — I2510 Atherosclerotic heart disease of native coronary artery without angina pectoris: Secondary | ICD-10-CM

## 2023-04-22 DIAGNOSIS — I2583 Coronary atherosclerosis due to lipid rich plaque: Secondary | ICD-10-CM | POA: Diagnosis not present

## 2023-04-22 MED ORDER — ATORVASTATIN CALCIUM 20 MG PO TABS: 10.0000 mg | ORAL_TABLET | Freq: Every day | ORAL | Status: DC

## 2023-04-22 NOTE — Patient Instructions (Signed)
 Medication Instructions:  DECREASE ATORVASTATIN TO 10 MG  *If you need a refill on your cardiac medications before your next appointment, please call your pharmacy*   Lab Work: DUE AUG LIPO PROFILE, LIVER ,A1C ,VIT D , AND APB  If you have labs (blood work) drawn today and your tests are completely normal, you will receive your results only by: MyChart Message (if you have MyChart) OR A paper copy in the mail If you have any lab test that is abnormal or we need to change your treatment, we will call you to review the results.   Testing/Procedures: NONE   Follow-Up: At [facility], you and your health needs are our priority.  As part of our continuing mission to provide you with exceptional heart care, we have created designated Provider Care Teams.  These Care Teams include your primary Cardiologist (physician) and Advanced Practice Providers (APPs -  Physician Assistants and Nurse Practitioners) who all work together to provide you with the care you need, when you need it.  We recommend signing up for the patient portal called "MyChart".  Sign up information is provided on this After Visit Summary.  MyChart is used to connect with patients for Virtual Visits (Telemedicine).  Patients are able to view lab/test results, encounter notes, upcoming appointments, etc.  Non-urgent messages can be sent to your provider as well.   To learn more about what you can do with MyChart, go to [email or web address].    Your next appointment:   1 year(s)  Provider:   DR Tenny Craw  Other Instructions NONE

## 2023-05-22 ENCOUNTER — Other Ambulatory Visit: Payer: Self-pay | Admitting: Internal Medicine

## 2023-09-16 ENCOUNTER — Other Ambulatory Visit: Payer: Self-pay

## 2023-10-03 LAB — HEMOGLOBIN A1C
Est. average glucose Bld gHb Est-mCnc: 103 mg/dL
Hgb A1c MFr Bld: 5.2 % (ref 4.8–5.6)

## 2023-10-04 ENCOUNTER — Ambulatory Visit: Payer: Self-pay | Admitting: Internal Medicine

## 2023-10-04 LAB — HEPATIC FUNCTION PANEL
ALT: 27 IU/L (ref 0–32)
AST: 23 IU/L (ref 0–40)
Albumin: 4.3 g/dL (ref 3.8–4.9)
Alkaline Phosphatase: 87 IU/L (ref 44–121)
Bilirubin Total: 0.6 mg/dL (ref 0.0–1.2)
Bilirubin, Direct: 0.2 mg/dL (ref 0.00–0.40)
Total Protein: 6.6 g/dL (ref 6.0–8.5)

## 2023-10-04 LAB — NMR, LIPOPROFILE
Cholesterol, Total: 131 mg/dL (ref 100–199)
HDL Particle Number: 36.2 umol/L (ref >=30.5)
HDL-C: 53 mg/dL (ref >39)
LDL Particle Number: 696 nmol/L (ref <1000)
LDL Size: 19.7 nm — ABNORMAL LOW (ref >20.5)
LDL-C (NIH Calc): 60 mg/dL (ref 0–99)
LP-IR Score: 43 (ref <=45)
Small LDL Particle Number: 477 nmol/L (ref <=527)
Triglycerides: 98 mg/dL (ref 0–149)

## 2023-10-04 LAB — VITAMIN D 25 HYDROXY (VIT D DEFICIENCY, FRACTURES): Vit D, 25-Hydroxy: 45.7 ng/mL (ref 30.0–100.0)

## 2023-10-04 LAB — APOLIPOPROTEIN B: Apolipoprotein B: 59 mg/dL (ref <90)
